# Patient Record
Sex: Female | Born: 1970 | Race: White | Hispanic: No | Marital: Single | Smoking: Never smoker
Health system: Southern US, Community
[De-identification: ages and names within clinical notes are randomized; demographics above are authoritative.]

## PROBLEM LIST (undated history)

## (undated) DIAGNOSIS — Z8489 Family history of other specified conditions: Secondary | ICD-10-CM

## (undated) DIAGNOSIS — C801 Malignant (primary) neoplasm, unspecified: Secondary | ICD-10-CM

## (undated) DIAGNOSIS — L570 Actinic keratosis: Secondary | ICD-10-CM

## (undated) DIAGNOSIS — K219 Gastro-esophageal reflux disease without esophagitis: Secondary | ICD-10-CM

## (undated) HISTORY — DX: Actinic keratosis: L57.0

## (undated) HISTORY — PX: ESOPHAGOGASTRODUODENOSCOPY: SHX1529

---

## 2005-08-09 ENCOUNTER — Ambulatory Visit: Payer: Self-pay

## 2005-08-25 ENCOUNTER — Ambulatory Visit: Payer: Self-pay

## 2007-12-19 ENCOUNTER — Ambulatory Visit: Payer: Self-pay | Admitting: Otolaryngology

## 2007-12-25 ENCOUNTER — Ambulatory Visit: Payer: Self-pay | Admitting: Otolaryngology

## 2008-12-24 ENCOUNTER — Ambulatory Visit: Payer: Self-pay | Admitting: Obstetrics and Gynecology

## 2010-03-26 ENCOUNTER — Ambulatory Visit: Payer: Self-pay

## 2011-06-09 ENCOUNTER — Ambulatory Visit: Payer: Self-pay

## 2011-07-08 ENCOUNTER — Ambulatory Visit: Payer: Self-pay | Admitting: Gastroenterology

## 2011-07-12 LAB — PATHOLOGY REPORT

## 2012-06-09 ENCOUNTER — Ambulatory Visit: Payer: Self-pay

## 2013-08-03 ENCOUNTER — Ambulatory Visit: Payer: Self-pay

## 2014-08-06 ENCOUNTER — Other Ambulatory Visit: Payer: Self-pay | Admitting: Obstetrics and Gynecology

## 2014-08-06 DIAGNOSIS — Z1231 Encounter for screening mammogram for malignant neoplasm of breast: Secondary | ICD-10-CM

## 2014-08-21 ENCOUNTER — Ambulatory Visit: Payer: Self-pay | Attending: Obstetrics and Gynecology

## 2014-08-27 ENCOUNTER — Encounter: Payer: Self-pay | Admitting: *Deleted

## 2014-08-28 ENCOUNTER — Encounter: Payer: Self-pay | Admitting: *Deleted

## 2014-09-02 NOTE — Discharge Instructions (Signed)

## 2014-09-03 ENCOUNTER — Ambulatory Visit: Payer: BLUE CROSS/BLUE SHIELD | Admitting: Anesthesiology

## 2014-09-03 ENCOUNTER — Ambulatory Visit
Admission: RE | Admit: 2014-09-03 | Discharge: 2014-09-03 | Disposition: A | Discharge status: Home / Self Care | Payer: BLUE CROSS/BLUE SHIELD | Source: Ambulatory Visit | Attending: Gastroenterology | Admitting: Gastroenterology

## 2014-09-03 ENCOUNTER — Encounter: Payer: Self-pay | Admitting: Anesthesiology

## 2014-09-03 ENCOUNTER — Encounter
Admission: RE | Disposition: A | Discharge status: Home / Self Care | Payer: Self-pay | Source: Ambulatory Visit | Attending: Gastroenterology

## 2014-09-03 DIAGNOSIS — K2950 Unspecified chronic gastritis without bleeding: Secondary | ICD-10-CM | POA: Diagnosis not present

## 2014-09-03 DIAGNOSIS — Z79899 Other long term (current) drug therapy: Secondary | ICD-10-CM | POA: Diagnosis not present

## 2014-09-03 DIAGNOSIS — K210 Gastro-esophageal reflux disease with esophagitis: Secondary | ICD-10-CM | POA: Diagnosis present

## 2014-09-03 DIAGNOSIS — R1013 Epigastric pain: Secondary | ICD-10-CM | POA: Insufficient documentation

## 2014-09-03 HISTORY — DX: Gastro-esophageal reflux disease without esophagitis: K21.9

## 2014-09-03 HISTORY — DX: Family history of other specified conditions: Z84.89

## 2014-09-03 HISTORY — PX: ESOPHAGOGASTRODUODENOSCOPY: SHX5428

## 2014-09-03 SURGERY — EGD (ESOPHAGOGASTRODUODENOSCOPY)
Anesthesia: Monitor Anesthesia Care | Wound class: Clean Contaminated

## 2014-09-03 MED ORDER — PROPOFOL 10 MG/ML IV BOLUS
INTRAVENOUS | Status: DC | PRN
  Administered 2014-09-03: 100 mg via INTRAVENOUS
  Administered 2014-09-03: 50 mg via INTRAVENOUS
  Administered 2014-09-03: 100 mg via INTRAVENOUS
  Administered 2014-09-03: 30 mg via INTRAVENOUS

## 2014-09-03 MED ORDER — LIDOCAINE HCL (CARDIAC) 20 MG/ML IV SOLN
INTRAVENOUS | Status: DC | PRN
  Administered 2014-09-03: 30 mg via INTRAVENOUS

## 2014-09-03 MED ORDER — PROMETHAZINE HCL 25 MG/ML IJ SOLN: 6.2500 mg | INTRAMUSCULAR | Status: DC | PRN

## 2014-09-03 MED ORDER — LACTATED RINGERS IV SOLN
INTRAVENOUS | Status: DC
  Administered 2014-09-03 (×2): via INTRAVENOUS

## 2014-09-03 MED ORDER — SODIUM CHLORIDE 0.9 % IV SOLN: INTRAVENOUS | Status: DC

## 2014-09-03 MED ORDER — GLYCOPYRROLATE 0.2 MG/ML IJ SOLN
INTRAMUSCULAR | Status: DC | PRN
  Administered 2014-09-03: 0.2 mg via INTRAVENOUS

## 2014-09-03 SURGICAL SUPPLY — 40 items
BALLN DILATOR 10-12 8 (BALLOONS)
BALLN DILATOR 12-15 8 (BALLOONS)
BALLN DILATOR 15-18 8 (BALLOONS)
BALLN DILATOR CRE 0-12 8 (BALLOONS)
BALLN DILATOR ESOPH 8 10 CRE (MISCELLANEOUS) IMPLANT
BALLOON DILATOR 12-15 8 (BALLOONS) IMPLANT
BALLOON DILATOR 15-18 8 (BALLOONS) IMPLANT
BALLOON DILATOR CRE 0-12 8 (BALLOONS) IMPLANT
BLOCK BITE 60FR ADLT L/F GRN (MISCELLANEOUS) ×3 IMPLANT
CANISTER SUCT 1200ML W/VALVE (MISCELLANEOUS) ×3 IMPLANT
FCP ESCP3.2XJMB 240X2.8X (MISCELLANEOUS)
FORCEPS BIOP RAD 4 LRG CAP 4 (CUTTING FORCEPS) ×3 IMPLANT
FORCEPS BIOP RJ4 240 W/NDL (MISCELLANEOUS)
FORCEPS ESCP3.2XJMB 240X2.8X (MISCELLANEOUS) IMPLANT
GOWN CVR UNV OPN BCK APRN NK (MISCELLANEOUS) ×1 IMPLANT
GOWN ISOL THUMB LOOP REG UNIV (MISCELLANEOUS) ×2
GOWN STRL REUS W/ TWL LRG LVL3 (GOWN DISPOSABLE) ×1 IMPLANT
GOWN STRL REUS W/TWL LRG LVL3 (GOWN DISPOSABLE) ×2
HEMOCLIP INSTINCT (CLIP) IMPLANT
INJECTOR VARIJECT VIN23 (MISCELLANEOUS) IMPLANT
KIT CO2 TUBING (TUBING) IMPLANT
KIT DEFENDO VALVE AND CONN (KITS) IMPLANT
KIT ENDO PROCEDURE OLY (KITS) ×3 IMPLANT
LIGATOR MULTIBAND 6SHOOTER MBL (MISCELLANEOUS) IMPLANT
MARKER SPOT ENDO TATTOO 5ML (MISCELLANEOUS) IMPLANT
PAD GROUND ADULT SPLIT (MISCELLANEOUS) IMPLANT
SNARE SHORT THROW 13M SML OVAL (MISCELLANEOUS) IMPLANT
SNARE SHORT THROW 30M LRG OVAL (MISCELLANEOUS) IMPLANT
SPOT EX ENDOSCOPIC TATTOO (MISCELLANEOUS)
SUCTION POLY TRAP 4CHAMBER (MISCELLANEOUS) IMPLANT
SYR INFLATION 60ML (SYRINGE) IMPLANT
TRAP SUCTION POLY (MISCELLANEOUS) IMPLANT
TUBING CONN 6MMX3.1M (TUBING)
TUBING SUCTION CONN 0.25 STRL (TUBING) IMPLANT
UNDERPAD 30X60 958B10 (PK) (MISCELLANEOUS) IMPLANT
VALVE BIOPSY ENDO (VALVE) IMPLANT
VARIJECT INJECTOR VIN23 (MISCELLANEOUS)
WATER AUXILLARY (MISCELLANEOUS) IMPLANT
WATER STERILE IRR 500ML POUR (IV SOLUTION) ×3 IMPLANT
WIRE CRE 18-20MM 8CM F G (MISCELLANEOUS) IMPLANT

## 2014-09-03 NOTE — Transfer of Care (Signed)
Immediate Anesthesia Transfer of Care Note  Patient: Pamela Colon  Procedure(s) Performed: Procedure(s): ESOPHAGOGASTRODUODENOSCOPY (EGD) (N/A)  Patient Location: PACU  Anesthesia Type: MAC  Level of Consciousness: awake, alert  and patient cooperative  Airway and Oxygen Therapy: Patient Spontanous Breathing and Patient connected to supplemental oxygen  Post-op Assessment: Post-op Vital signs reviewed, Patient's Cardiovascular Status Stable, Respiratory Function Stable, Patent Airway and No signs of Nausea or vomiting  Post-op Vital Signs: Reviewed and stable  Complications: No apparent anesthesia complications

## 2014-09-03 NOTE — Anesthesia Postprocedure Evaluation (Signed)
  Anesthesia Post-op Note  Patient: Pamela Colon  Procedure(s) Performed: Procedure(s): ESOPHAGOGASTRODUODENOSCOPY (EGD) (N/A)  Anesthesia type:MAC  Patient location: PACU  Post pain: Pain level controlled  Post assessment: Post-op Vital signs reviewed, Patient's Cardiovascular Status Stable, Respiratory Function Stable, Patent Airway and No signs of Nausea or vomiting  Post vital signs: Reviewed and stable  Last Vitals:  Filed Vitals:   09/03/14 0953  BP: 111/84  Pulse: 66  Temp:   Resp: 16    Level of consciousness: awake, alert  and patient cooperative  Complications: No apparent anesthesia complications

## 2014-09-03 NOTE — H&P (Signed)
  Date of Initial H&P:08/13/2014  History reviewed, patient examined, no change in status, stable for surgery.

## 2014-09-03 NOTE — Op Note (Signed)
[facility] Gastroenterology Patient Name: Pamela Colon Procedure Date: 09/03/2014 8:57 AM MRN: [id number] Account #: [id number] Date of Birth: April 15, 1970 Admit Type: Outpatient Age: 44 Room: [facility] OR ROOM 01 Gender: Female Note Status: Finalized Procedure:         Upper GI endoscopy Indications:       Epigastric abdominal pain, Suspected esophageal reflux Providers:         Lupita Dawn. Candace Cruise, MD Medicines:         Monitored Anesthesia Care Complications:     No immediate complications. Procedure:         Pre-Anesthesia Assessment:                    - Prior to the procedure, a History and Physical was                     performed, and patient medications, allergies and                     sensitivities were reviewed. The patient's tolerance of                     previous anesthesia was reviewed.                    - The risks and benefits of the procedure and the sedation                     options and risks were discussed with the patient. All                     questions were answered and informed consent was obtained.                    - After reviewing the risks and benefits, the patient was                     deemed in satisfactory condition to undergo the procedure.                    After obtaining informed consent, the endoscope was passed                     under direct vision. Throughout the procedure, the                     patient's blood pressure, pulse, and oxygen saturations                     were monitored continuously. The Olympus GIF H180J                     colonscope (X#:[id number]) was introduced through the mouth,                     and advanced to the second part of duodenum. The upper GI                     endoscopy was accomplished without difficulty. The patient                     tolerated the procedure well. Findings:      LA Grade A (one or more mucosal breaks less than 5 mm, not extending  between tops of 2 mucosal  folds) esophagitis was found in the lower       third of the esophagus.      The exam was otherwise without abnormality.      The entire examined stomach was normal. Biopsies were taken with a cold       forceps for histology.      The examined duodenum was normal. Impression:        - LA Grade A reflux esophagitis. Rule out Barrett's                     esophagus.                    - The examination was otherwise normal.                    - Normal stomach. Biopsied.                    - Normal examined duodenum. Recommendation:    - Discharge patient to home.                    - Observe patient's clinical course.                    - Await pathology results.                    - The findings and recommendations were discussed with the                     patient. Procedure Code(s): --- Professional ---                    [phone], Esophagogastroduodenoscopy, flexible, transoral;                     with biopsy, single or multiple Diagnosis Code(s): --- Professional ---                    K21.0, Gastro-esophageal reflux disease with esophagitis                    R10.13, Epigastric pain CPT copyright 2014 American Medical Association. All rights reserved. The codes documented in this report are preliminary and upon coder review may  be revised to meet current compliance requirements. Hulen Luster, MD 09/03/2014 9:34:59 AM This report has been signed electronically. Number of Addenda: 0 Note Initiated On: 09/03/2014 8:57 AM      [facility]

## 2014-09-03 NOTE — Anesthesia Preprocedure Evaluation (Addendum)
Anesthesia Evaluation  Patient identified by MRN, date of birth, ID band Patient awake    Reviewed: Allergy & Precautions, H&P , NPO status , Patient's Chart, lab work & pertinent test results  History of Anesthesia Complications Negative for: history of anesthetic complications  Airway Mallampati: II  TM Distance: >3 FB Neck ROM: full    Dental no notable dental hx.    Pulmonary neg pulmonary ROS,    Pulmonary exam normal       Cardiovascular negative cardio ROS Normal cardiovascular exam    Neuro/Psych    GI/Hepatic Neg liver ROS, GERD-  Medicated and Controlled,  Endo/Other  negative endocrine ROS  Renal/GU negative Renal ROS     Musculoskeletal   Abdominal   Peds  Hematology negative hematology ROS (+)   Anesthesia Other Findings   Reproductive/Obstetrics negative OB ROS                            Anesthesia Physical Anesthesia Plan  ASA: I  Anesthesia Plan: MAC   Post-op Pain Management:    Induction:   Airway Management Planned:   Additional Equipment:   Intra-op Plan:   Post-operative Plan:   Informed Consent: I have reviewed the patients History and Physical, chart, labs and discussed the procedure including the risks, benefits and alternatives for the proposed anesthesia with the patient or authorized representative who has indicated his/her understanding and acceptance.     Plan Discussed with: CRNA  Anesthesia Plan Comments:         Anesthesia Quick Evaluation

## 2014-09-03 NOTE — Anesthesia Procedure Notes (Signed)
Procedure Name: MAC Performed by: Quame Spratlin Pre-anesthesia Checklist: Patient identified, Emergency Drugs available, Suction available, Patient being monitored and Timeout performed Patient Re-evaluated:Patient Re-evaluated prior to inductionOxygen Delivery Method: Nasal cannula       

## 2014-09-04 ENCOUNTER — Encounter: Payer: Self-pay | Admitting: Gastroenterology

## 2014-09-06 LAB — SURGICAL PATHOLOGY

## 2014-10-03 ENCOUNTER — Ambulatory Visit
Admission: RE | Admit: 2014-10-03 | Discharge: 2014-10-03 | Disposition: A | Discharge status: Home / Self Care | Payer: BLUE CROSS/BLUE SHIELD | Source: Ambulatory Visit | Attending: Obstetrics and Gynecology | Admitting: Obstetrics and Gynecology

## 2014-10-03 DIAGNOSIS — Z1231 Encounter for screening mammogram for malignant neoplasm of breast: Secondary | ICD-10-CM | POA: Diagnosis present

## 2014-10-21 DIAGNOSIS — C4491 Basal cell carcinoma of skin, unspecified: Secondary | ICD-10-CM

## 2014-10-21 HISTORY — DX: Basal cell carcinoma of skin, unspecified: C44.91

## 2015-08-22 ENCOUNTER — Other Ambulatory Visit: Payer: Self-pay | Admitting: Obstetrics and Gynecology

## 2015-08-22 DIAGNOSIS — Z1231 Encounter for screening mammogram for malignant neoplasm of breast: Secondary | ICD-10-CM

## 2015-10-06 ENCOUNTER — Ambulatory Visit: Payer: BLUE CROSS/BLUE SHIELD

## 2015-11-17 ENCOUNTER — Ambulatory Visit
Admission: RE | Admit: 2015-11-17 | Discharge: 2015-11-17 | Disposition: A | Discharge status: Home / Self Care | Payer: Managed Care, Other (non HMO) | Source: Ambulatory Visit | Attending: Obstetrics and Gynecology | Admitting: Obstetrics and Gynecology

## 2015-11-17 ENCOUNTER — Ambulatory Visit: Payer: BLUE CROSS/BLUE SHIELD

## 2015-11-17 ENCOUNTER — Other Ambulatory Visit: Payer: Self-pay | Admitting: Obstetrics and Gynecology

## 2015-11-17 DIAGNOSIS — Z1231 Encounter for screening mammogram for malignant neoplasm of breast: Secondary | ICD-10-CM | POA: Diagnosis not present

## 2015-11-17 HISTORY — DX: Malignant (primary) neoplasm, unspecified: C80.1

## 2016-01-28 ENCOUNTER — Other Ambulatory Visit: Payer: Self-pay | Admitting: Obstetrics and Gynecology

## 2016-01-28 DIAGNOSIS — Z1231 Encounter for screening mammogram for malignant neoplasm of breast: Secondary | ICD-10-CM

## 2017-01-05 ENCOUNTER — Ambulatory Visit
Admission: RE | Admit: 2017-01-05 | Discharge: 2017-01-05 | Disposition: A | Discharge status: Home / Self Care | Payer: Managed Care, Other (non HMO) | Source: Ambulatory Visit | Attending: Obstetrics and Gynecology | Admitting: Obstetrics and Gynecology

## 2017-01-05 DIAGNOSIS — Z1231 Encounter for screening mammogram for malignant neoplasm of breast: Secondary | ICD-10-CM | POA: Diagnosis not present

## 2018-02-01 ENCOUNTER — Other Ambulatory Visit: Payer: Self-pay | Admitting: Obstetrics and Gynecology

## 2018-02-01 DIAGNOSIS — Z1231 Encounter for screening mammogram for malignant neoplasm of breast: Secondary | ICD-10-CM

## 2018-02-09 ENCOUNTER — Ambulatory Visit: Payer: Managed Care, Other (non HMO)

## 2018-02-23 ENCOUNTER — Encounter (INDEPENDENT_AMBULATORY_CARE_PROVIDER_SITE_OTHER): Payer: Self-pay

## 2018-02-23 ENCOUNTER — Ambulatory Visit
Admission: RE | Admit: 2018-02-23 | Discharge: 2018-02-23 | Disposition: A | Discharge status: Home / Self Care | Payer: Managed Care, Other (non HMO) | Source: Ambulatory Visit | Attending: Obstetrics and Gynecology | Admitting: Obstetrics and Gynecology

## 2018-02-23 DIAGNOSIS — Z1231 Encounter for screening mammogram for malignant neoplasm of breast: Secondary | ICD-10-CM | POA: Diagnosis not present

## 2019-02-14 ENCOUNTER — Other Ambulatory Visit: Payer: Self-pay | Admitting: Certified Nurse Midwife

## 2019-02-14 DIAGNOSIS — Z1231 Encounter for screening mammogram for malignant neoplasm of breast: Secondary | ICD-10-CM

## 2019-02-27 ENCOUNTER — Ambulatory Visit
Admission: RE | Admit: 2019-02-27 | Discharge: 2019-02-27 | Disposition: A | Discharge status: Home / Self Care | Payer: Managed Care, Other (non HMO) | Source: Ambulatory Visit | Attending: Certified Nurse Midwife | Admitting: Certified Nurse Midwife

## 2019-02-27 ENCOUNTER — Encounter (INDEPENDENT_AMBULATORY_CARE_PROVIDER_SITE_OTHER): Payer: Self-pay

## 2019-02-27 ENCOUNTER — Other Ambulatory Visit: Payer: Self-pay

## 2019-02-27 DIAGNOSIS — Z1231 Encounter for screening mammogram for malignant neoplasm of breast: Secondary | ICD-10-CM | POA: Insufficient documentation

## 2019-08-26 IMAGING — MG DIGITAL SCREENING BILATERAL MAMMOGRAM WITH TOMO AND CAD
8 series · 8 of 24 positions shown · non-contrast
Comparison: Previous exam(s).

CLINICAL DATA: Screening.

EXAM:
DIGITAL SCREENING BILATERAL MAMMOGRAM WITH TOMO AND CAD

[R MLO synth-2D]
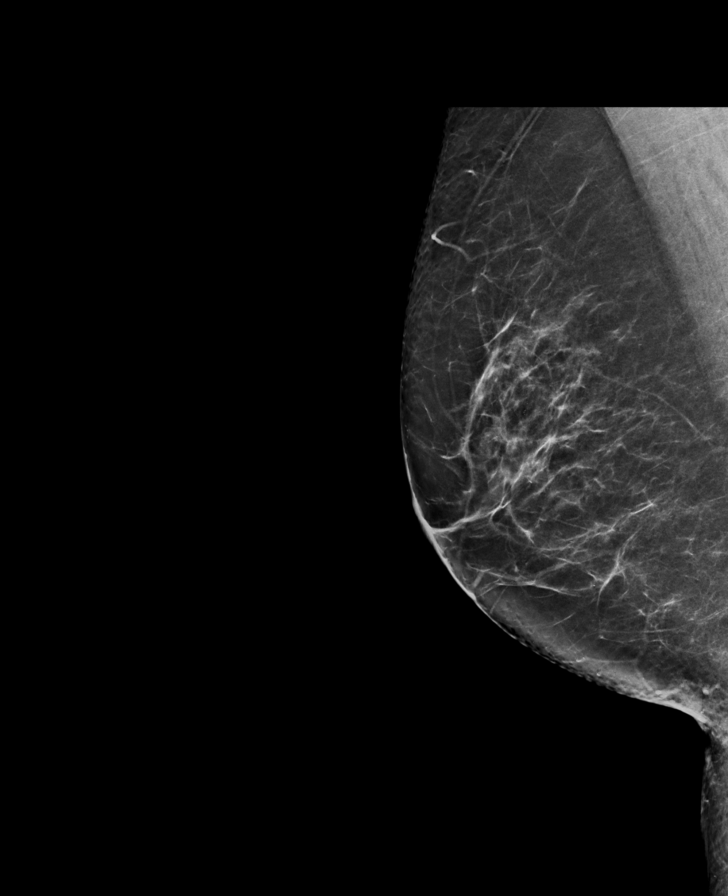

[L CC synth-2D]
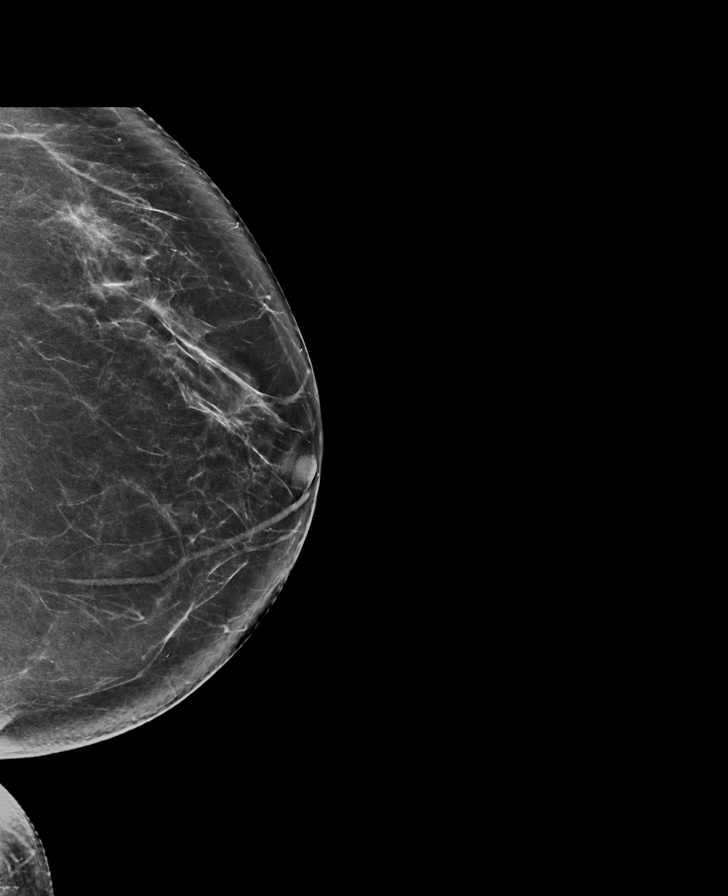

[R CC synth-2D]
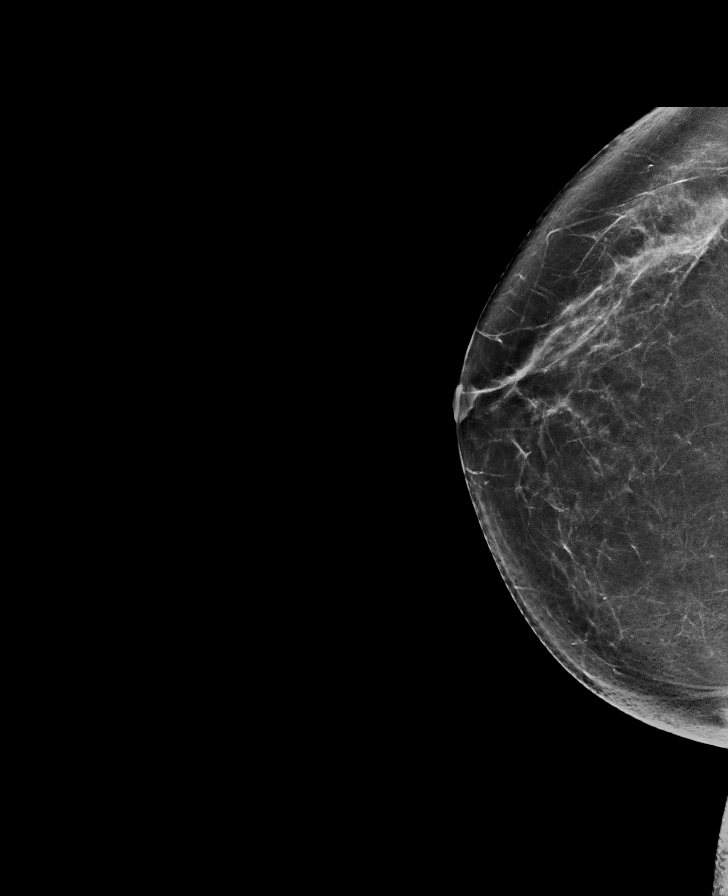

[L MLO synth-2D]
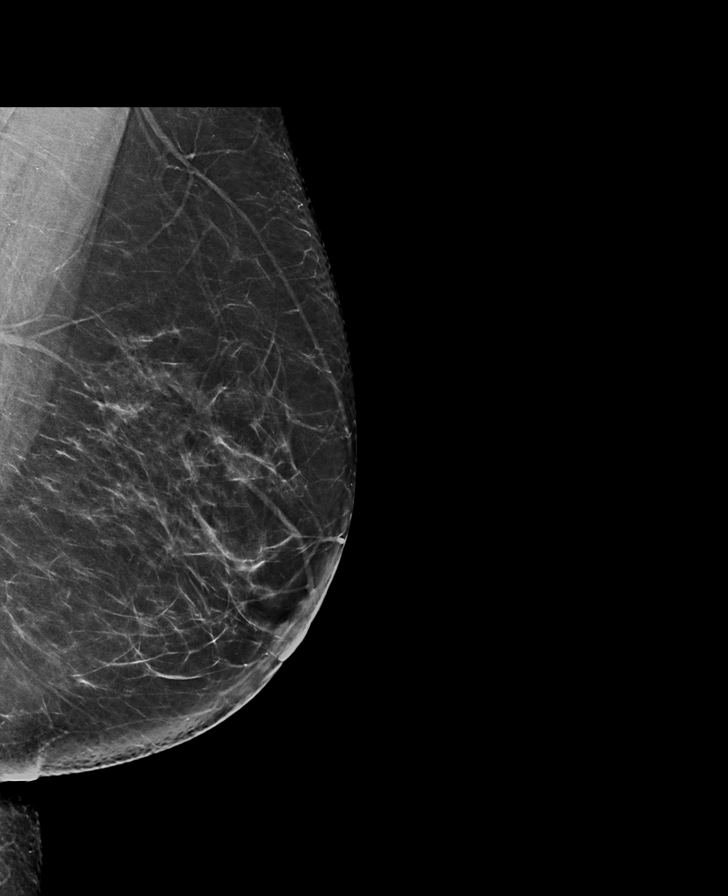

[R MLO tomo · tomo slice 39/76.0]
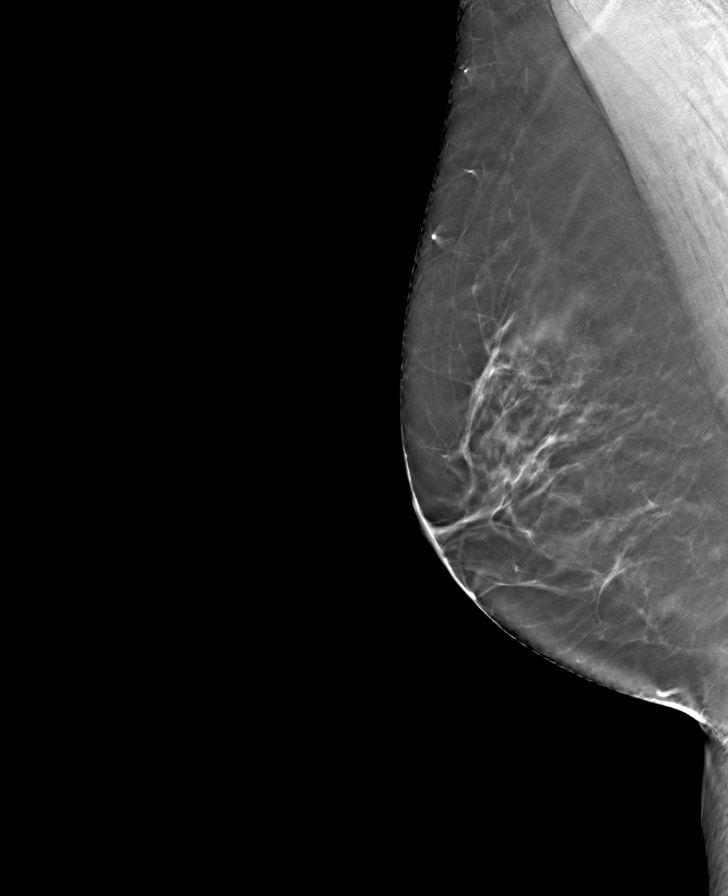

[R CC tomo · tomo slice 39/78.0]
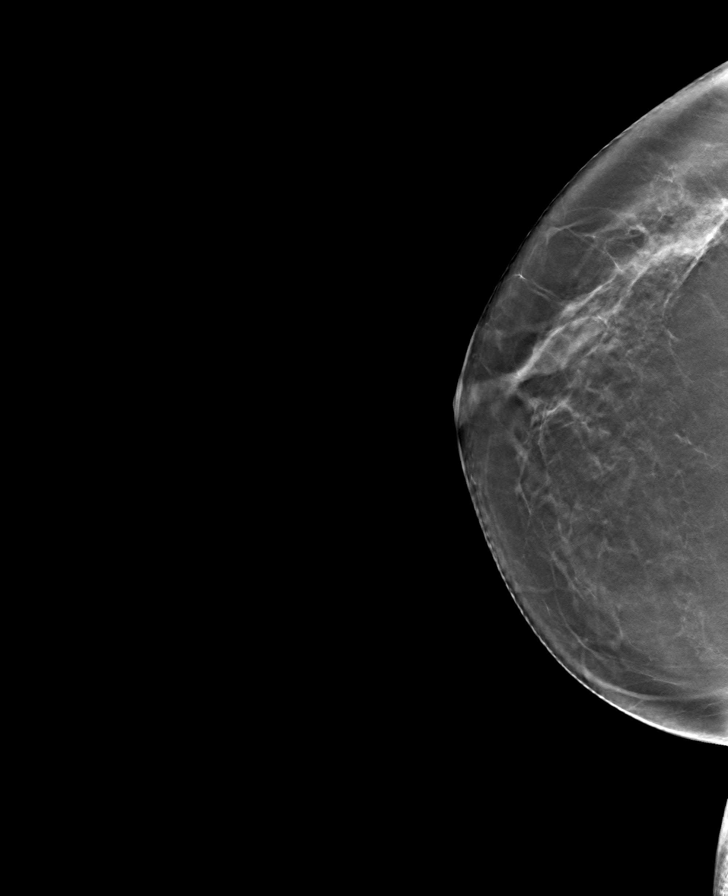

[L MLO tomo · tomo slice 41/80.0]
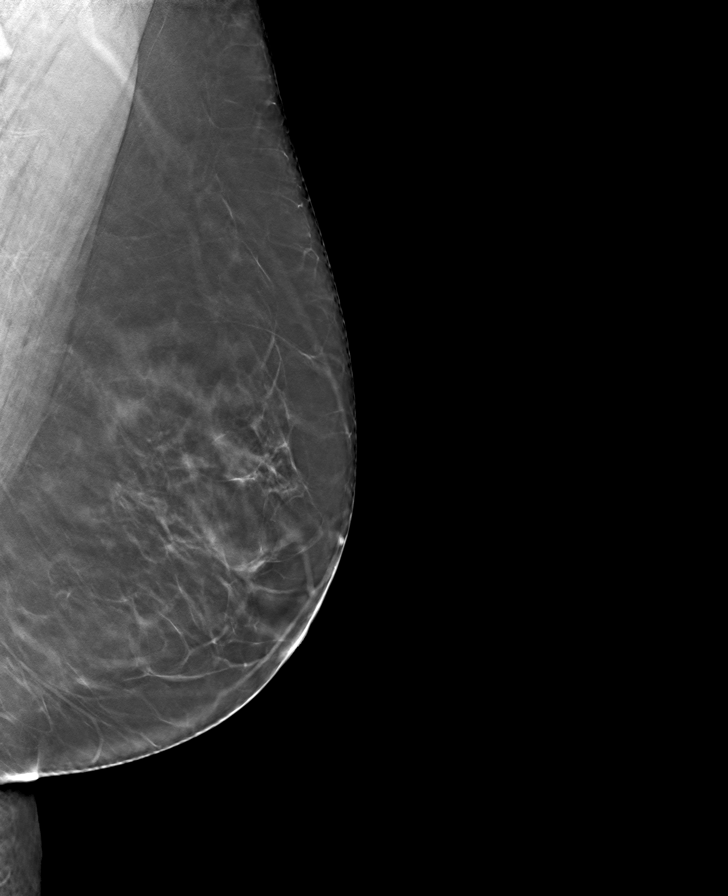

[L CC tomo · tomo slice 41/81.0]
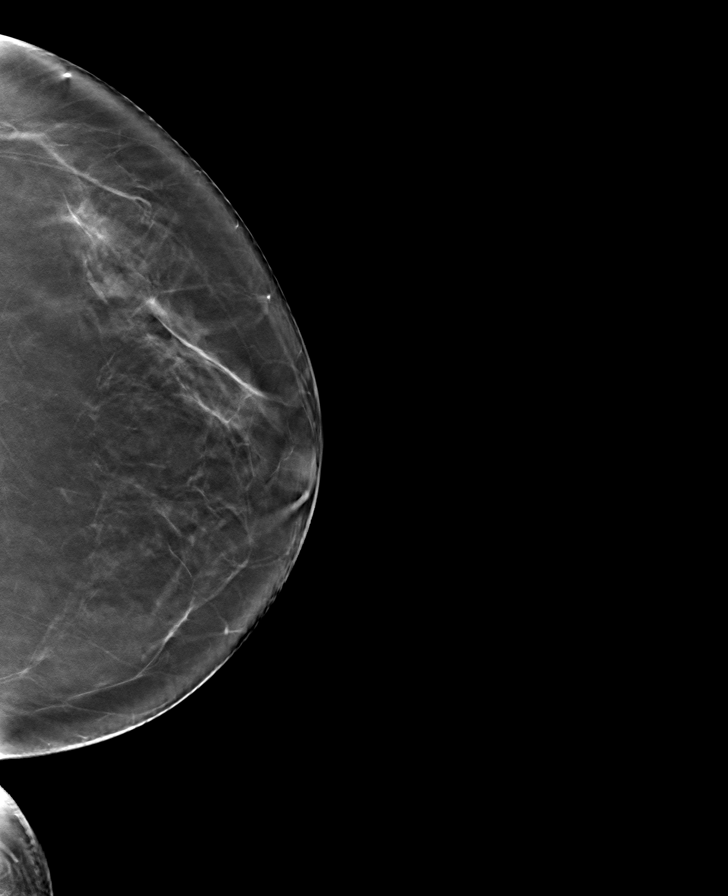

[8 of 24 positions shown; findings below may reference images not displayed]

ACR Breast Density Category b: There are scattered areas of
fibroglandular density.
FINDINGS: There are no findings suspicious for malignancy. Images were
processed with CAD.
IMPRESSION: No mammographic evidence of malignancy. A result letter of this
screening mammogram will be mailed directly to the patient.

RECOMMENDATION:
Screening mammogram in one year. (Code:[id number]-U-775)

BI-RADS CATEGORY  1: Negative.

## 2019-10-29 ENCOUNTER — Other Ambulatory Visit: Payer: Self-pay

## 2019-10-29 ENCOUNTER — Ambulatory Visit: Payer: Managed Care, Other (non HMO) | Admitting: Dermatology

## 2019-10-29 DIAGNOSIS — L814 Other melanin hyperpigmentation: Secondary | ICD-10-CM

## 2019-10-29 DIAGNOSIS — L813 Cafe au lait spots: Secondary | ICD-10-CM

## 2019-10-29 DIAGNOSIS — L720 Epidermal cyst: Secondary | ICD-10-CM

## 2019-10-29 DIAGNOSIS — T23261A Burn of second degree of back of right hand, initial encounter: Secondary | ICD-10-CM | POA: Diagnosis not present

## 2019-10-29 DIAGNOSIS — Z1283 Encounter for screening for malignant neoplasm of skin: Secondary | ICD-10-CM | POA: Diagnosis not present

## 2019-10-29 DIAGNOSIS — L820 Inflamed seborrheic keratosis: Secondary | ICD-10-CM

## 2019-10-29 DIAGNOSIS — Z85828 Personal history of other malignant neoplasm of skin: Secondary | ICD-10-CM

## 2019-10-29 DIAGNOSIS — D229 Melanocytic nevi, unspecified: Secondary | ICD-10-CM

## 2019-10-29 DIAGNOSIS — D1800 Hemangioma unspecified site: Secondary | ICD-10-CM

## 2019-10-29 DIAGNOSIS — L821 Other seborrheic keratosis: Secondary | ICD-10-CM

## 2019-10-29 DIAGNOSIS — L578 Other skin changes due to chronic exposure to nonionizing radiation: Secondary | ICD-10-CM

## 2019-10-29 NOTE — Patient Instructions (Addendum)
Melanoma ABCDEs  Melanoma is the most dangerous type of skin cancer, and is the leading cause of death from skin disease.  You are more likely to develop melanoma if you:  Have light-colored skin, light-colored eyes, or red or blond hair  Spend a lot of time in the sun  Tan regularly, either outdoors or in a tanning bed  Have had blistering sunburns, especially during childhood  Have a close family member who has had a melanoma  Have atypical moles or large birthmarks  Early detection of melanoma is key since treatment is typically straightforward and cure rates are extremely high if we catch it early.   The first sign of melanoma is often a change in a mole or a new dark spot.  The ABCDE system is a way of remembering the signs of melanoma.  A for asymmetry:  The two halves do not match. B for border:  The edges of the growth are irregular. C for color:  A mixture of colors are present instead of an even brown color. D for diameter:  Melanomas are usually (but not always) greater than 6mm - the size of a pencil eraser. E for evolution:  The spot keeps changing in size, shape, and color.  Please check your skin once per month between visits. You can use a small mirror in front and a large mirror behind you to keep an eye on the back side or your body.   If you see any new or changing lesions before your next follow-up, please call to schedule a visit.  Please continue daily skin protection including broad spectrum sunscreen SPF 30+ to sun-exposed areas, reapplying every 2 hours as needed when you're outdoors.    Cryotherapy Aftercare  . Wash gently with soap and water everyday.   . Apply Vaseline and Band-Aid daily until healed.   Seborrheic Keratosis  What causes seborrheic keratoses? Seborrheic keratoses are harmless, common skin growths that first appear during adult life.  As time goes by, more growths appear.  Some people may develop a large number of them.  Seborrheic  keratoses appear on both covered and uncovered body parts.  They are not caused by sunlight.  The tendency to develop seborrheic keratoses can be inherited.  They vary in color from skin-colored to gray, brown, or even black.  They can be either smooth or have a rough, warty surface.   Seborrheic keratoses are superficial and look as if they were stuck on the skin.  Under the microscope this type of keratosis looks like layers upon layers of skin.  That is why at times the top layer may seem to fall off, but the rest of the growth remains and re-grows.    Treatment Seborrheic keratoses do not need to be treated, but can easily be removed in the office.  Seborrheic keratoses often cause symptoms when they rub on clothing or jewelry.  Lesions can be in the way of shaving.  If they become inflamed, they can cause itching, soreness, or burning.  Removal of a seborrheic keratosis can be accomplished by freezing, burning, or surgery. If any spot bleeds, scabs, or grows rapidly, please return to have it checked, as these can be an indication of a skin cancer.  

## 2019-10-29 NOTE — Progress Notes (Signed)
Follow-Up Visit   Subjective  Pamela Colon is a 49 y.o. female who presents for the following: Annual Exam.  Patient here today for TBSE. She does have a history of BCC. Patient advises there is a mole on her back that has been itchy for a few months. She has another irritated spot on her shoulder that has been frozen several times but comes back.  Nothing else new or changing that patient is aware of.   The following portions of the chart were reviewed this encounter and updated as appropriate:      Review of Systems:  No other skin or systemic complaints except as noted in HPI or Assessment and Plan.  Objective  Well appearing patient in no apparent distress; mood and affect are within normal limits.  A full examination was performed including scalp, head, eyes, ears, nose, lips, neck, chest, axillae, abdomen, back, buttocks, bilateral upper extremities, bilateral lower extremities, hands, feet, fingers, toes, fingernails, and toenails. All findings within normal limits unless otherwise noted below.  Objective  Right Upper Back x 1, right top of shoulder x 1 (2): Erythematous keratotic or waxy stuck-on papule or plaque.   Objective  Periocular: Tiny smooth white papule(s).   Objective  Right Hand: Linear hyperpigmented eroded patch- history of recent burn couple days ago.   Assessment & Plan  Inflamed seborrheic keratosis (2) Right Upper Back x 1, right top of shoulder x 1  Destruction of lesion - Right Upper Back x 1, right top of shoulder x 1 Complexity: simple   Destruction method: cryotherapy   Informed consent: discussed and consent obtained   Lesion destroyed using liquid nitrogen: Yes   Region frozen until ice ball extended beyond lesion: Yes   Outcome: patient tolerated procedure well with no complications   Post-procedure details: wound care instructions given    [place] Periocular  Benign, observe.  Start OTC adapalene 0.1% gel qohs prn as  tolerated, sample of Effaclar given.    Partial thickness burn of back of right hand, initial encounter Right Hand  Recommend wound care- daily Vaseline and keep covered with band-aid, sun protection   Lentigines - Scattered tan macules - Discussed due to sun exposure - Benign, observe - Call for any changes  Seborrheic Keratoses - Stuck-on, waxy, tan-brown papules and plaques  - Discussed benign etiology and prognosis. - Observe - Call for any changes  Melanocytic Nevi - Tan-brown and/or pink-flesh-colored symmetric macules and papules back - Benign appearing on exam today - Observation - Call clinic for new or changing moles - Recommend daily use of broad spectrum spf 30+ sunscreen to sun-exposed areas.   Hemangiomas - Red papules - Discussed benign nature - Observe - Call for any changes  Actinic Damage - diffuse scaly erythematous macules with underlying dyspigmentation - Recommend daily broad spectrum sunscreen SPF 30+ to sun-exposed areas, reapply every 2 hours as needed.  - Call for new or changing lesions.  Skin cancer screening performed today.  History of Basal Cell Carcinoma of the Skin - No evidence of recurrence today at right upper sternum - Recommend regular full body skin exams - Recommend daily broad spectrum sunscreen SPF 30+ to sun-exposed areas, reapply every 2 hours as needed.  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  Graciella Belton, RMA, am acting as scribe for Brendolyn Patty, MD . Documentation: I have reviewed the above documentation for accuracy and completeness, and I agree with the above.  Brendolyn Patty MD

## 2020-03-05 ENCOUNTER — Other Ambulatory Visit: Payer: Self-pay

## 2020-03-05 DIAGNOSIS — Z1231 Encounter for screening mammogram for malignant neoplasm of breast: Secondary | ICD-10-CM

## 2020-03-13 ENCOUNTER — Other Ambulatory Visit: Payer: Self-pay

## 2020-03-13 ENCOUNTER — Ambulatory Visit
Admission: RE | Admit: 2020-03-13 | Discharge: 2020-03-13 | Disposition: A | Discharge status: Home / Self Care | Payer: Managed Care, Other (non HMO) | Source: Ambulatory Visit

## 2020-03-13 DIAGNOSIS — Z1231 Encounter for screening mammogram for malignant neoplasm of breast: Secondary | ICD-10-CM

## 2020-03-20 ENCOUNTER — Other Ambulatory Visit: Payer: Self-pay

## 2020-03-20 DIAGNOSIS — R921 Mammographic calcification found on diagnostic imaging of breast: Secondary | ICD-10-CM

## 2020-03-20 DIAGNOSIS — R928 Other abnormal and inconclusive findings on diagnostic imaging of breast: Secondary | ICD-10-CM

## 2020-03-31 ENCOUNTER — Other Ambulatory Visit: Payer: Self-pay

## 2020-03-31 ENCOUNTER — Ambulatory Visit
Admission: RE | Admit: 2020-03-31 | Discharge: 2020-03-31 | Disposition: A | Discharge status: Home / Self Care | Payer: Managed Care, Other (non HMO) | Source: Ambulatory Visit

## 2020-03-31 DIAGNOSIS — R928 Other abnormal and inconclusive findings on diagnostic imaging of breast: Secondary | ICD-10-CM | POA: Diagnosis present

## 2020-03-31 DIAGNOSIS — R921 Mammographic calcification found on diagnostic imaging of breast: Secondary | ICD-10-CM | POA: Insufficient documentation

## 2020-07-08 ENCOUNTER — Ambulatory Visit: Payer: Managed Care, Other (non HMO) | Admitting: Dermatology

## 2020-11-04 ENCOUNTER — Ambulatory Visit: Payer: Managed Care, Other (non HMO) | Admitting: Dermatology

## 2020-11-04 ENCOUNTER — Other Ambulatory Visit: Payer: Self-pay

## 2020-11-04 DIAGNOSIS — D1800 Hemangioma unspecified site: Secondary | ICD-10-CM

## 2020-11-04 DIAGNOSIS — Z1283 Encounter for screening for malignant neoplasm of skin: Secondary | ICD-10-CM | POA: Diagnosis not present

## 2020-11-04 DIAGNOSIS — W57XXXA Bitten or stung by nonvenomous insect and other nonvenomous arthropods, initial encounter: Secondary | ICD-10-CM

## 2020-11-04 DIAGNOSIS — D2361 Other benign neoplasm of skin of right upper limb, including shoulder: Secondary | ICD-10-CM

## 2020-11-04 DIAGNOSIS — L570 Actinic keratosis: Secondary | ICD-10-CM | POA: Diagnosis not present

## 2020-11-04 DIAGNOSIS — L821 Other seborrheic keratosis: Secondary | ICD-10-CM

## 2020-11-04 DIAGNOSIS — S90561A Insect bite (nonvenomous), right ankle, initial encounter: Secondary | ICD-10-CM | POA: Diagnosis not present

## 2020-11-04 DIAGNOSIS — L814 Other melanin hyperpigmentation: Secondary | ICD-10-CM | POA: Diagnosis not present

## 2020-11-04 DIAGNOSIS — L578 Other skin changes due to chronic exposure to nonionizing radiation: Secondary | ICD-10-CM

## 2020-11-04 DIAGNOSIS — D225 Melanocytic nevi of trunk: Secondary | ICD-10-CM

## 2020-11-04 DIAGNOSIS — Z85828 Personal history of other malignant neoplasm of skin: Secondary | ICD-10-CM

## 2020-11-04 NOTE — Progress Notes (Signed)
Follow-Up Visit   Subjective  Pamela Colon is a 50 y.o. female who presents for the following: Annual Exam (Patient here today for full body exam. She reports no new concerns. ). She has noticed a persistent scaly spot R cheek, that she treats with lotion  Patient here for full body skin exam and skin cancer screening.  The following portions of the chart were reviewed this encounter and updated as appropriate:       Objective  Well appearing patient in no apparent distress; mood and affect are within normal limits.  A full examination was performed including scalp, head, eyes, ears, nose, lips, neck, chest, axillae, abdomen, back, buttocks, bilateral upper extremities, bilateral lower extremities, hands, feet, fingers, toes, fingernails, and toenails. All findings within normal limits unless otherwise noted below.  right lateral cheek x 1 Erythematous thin papules/macules with gritty scale.   Right Ankle - Anterior Excoriated pink papule at right ankle   Assessment & Plan  Actinic keratosis right lateral cheek x 1  Actinic keratoses are precancerous spots that appear secondary to cumulative UV radiation exposure/sun exposure over time. They are chronic with expected duration over 1 year. A portion of actinic keratoses will progress to squamous cell carcinoma of the skin. It is not possible to reliably predict which spots will progress to skin cancer and so treatment is recommended to prevent development of skin cancer.  Recommend daily broad spectrum sunscreen SPF 30+ to sun-exposed areas, reapply every 2 hours as needed.  Recommend staying in the shade or wearing long sleeves, sun glasses (UVA+UVB protection) and wide brim hats (4-inch brim around the entire circumference of the hat). Call for new or changing lesions.  Destruction of lesion - right lateral cheek x 1  Destruction method: cryotherapy   Informed consent: discussed and consent obtained   Lesion destroyed  using liquid nitrogen: Yes   Region frozen until ice ball extended beyond lesion: Yes   Outcome: patient tolerated procedure well with no complications   Post-procedure details: wound care instructions given   Additional details:  Prior to procedure, discussed risks of blister formation, small wound, skin dyspigmentation, or rare scar following cryotherapy. Recommend Vaseline ointment to treated areas while healing.   Bug bite without infection, initial encounter Right Ankle - Anterior  Healing  Not bothersome to patient   No treatment necessary   Lentigines - Scattered tan macules - Due to sun exposure - Benign-appering, observe - Recommend daily broad spectrum sunscreen SPF 30+ to sun-exposed areas, reapply every 2 hours as needed. 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 - Observe - Call for any changes  Melanocytic Nevi - Tan-brown and/or pink-flesh-colored symmetric macules and papules - Benign appearing on exam today - Observation - Call clinic for new or changing moles - Recommend daily use of broad spectrum spf 30+ sunscreen to sun-exposed areas.   Hemangiomas - Red papules - Discussed benign nature - Observe - Call for any changes   Actinic Damage - Chronic condition, secondary to cumulative UV/sun exposure - diffuse scaly erythematous macules with underlying dyspigmentation - Recommend daily broad spectrum sunscreen SPF 30+  to sun-exposed areas, reapply every 2 hours as needed.  - Staying in the shade or wearing long sleeves, sun glasses (UVA+UVB protection) and wide brim hats (4-inch brim  around the entire circumference of the hat) are also recommended for sun protection.  - Call for new or changing lesions.  History of Basal Cell Carcinoma of the Skin - No evidence of recurrence today at right upper sternum (2016) - Recommend regular full body skin exams - Recommend daily broad spectrum sunscreen SPF 30+ to sun-exposed areas, reapply every 2 hours as needed.  - Call if any new or changing lesions are noted between office visits  Skin cancer screening performed today.  Return in about 1 year (around 11/04/2021) for tbse. I, Ruthell Rummage, CMA, am acting as scribe for Brendolyn Patty, MD.  Documentation: I have reviewed the above documentation for accuracy and completeness, and I agree with the above.  Brendolyn Patty MD

## 2020-11-04 NOTE — Patient Instructions (Addendum)
Actinic keratoses are precancerous spots that appear secondary to cumulative UV radiation exposure/sun exposure over time. They are chronic with expected duration over 1 year. A portion of actinic keratoses will progress to squamous cell carcinoma of the skin. It is not possible to reliably predict which spots will progress to skin cancer and so treatment is recommended to prevent development of skin cancer.  Recommend daily broad spectrum sunscreen SPF 30+ to sun-exposed areas, reapply every 2 hours as needed.  Recommend staying in the shade or wearing long sleeves, sun glasses (UVA+UVB protection) and wide brim hats (4-inch brim around the entire circumference of the hat). Call for new or changing lesions.   Cryotherapy Aftercare  Wash gently with soap and water everyday.   Apply Vaseline and Band-Aid daily until healed.   Melanoma ABCDEs  Melanoma is the most dangerous type of skin cancer, and is the leading cause of death from skin disease.  You are more likely to develop melanoma if you: Have light-colored skin, light-colored eyes, or red or blond hair Spend a lot of time in the sun Tan regularly, either outdoors or in a tanning bed Have had blistering sunburns, especially during childhood Have a close family member who has had a melanoma Have atypical moles or large birthmarks  Early detection of melanoma is key since treatment is typically straightforward and cure rates are extremely high if we catch it early.   The first sign of melanoma is often a change in a mole or a new dark spot.  The ABCDE system is a way of remembering the signs of melanoma.  A for asymmetry:  The two halves do not match. B for border:  The edges of the growth are irregular. C for color:  A mixture of colors are present instead of an even brown color. D for diameter:  Melanomas are usually (but not always) greater than 45mm - the size of a pencil eraser. E for evolution:  The spot keeps changing in size,  shape, and color.  Please check your skin once per month between visits. You can use a small mirror in front and a large mirror behind you to keep an eye on the back side or your body.   If you see any new or changing lesions before your next follow-up, please call to schedule a visit.  Please continue daily skin protection including broad spectrum sunscreen SPF 30+ to sun-exposed areas, reapplying every 2 hours as needed when you're outdoors.   Staying in the shade or wearing long sleeves, sun glasses (UVA+UVB protection) and wide brim hats (4-inch brim around the entire circumference of the hat) are also recommended for sun protection.    If you have any questions or concerns for your doctor, please call our main line at [phone] and press option 4 to reach your doctor's medical assistant. If no one answers, please leave a voicemail as directed and we will return your call as soon as possible. Messages left after 4 pm will be answered the following business day.   You may also send [place] a message via [place]. We typically respond to MyChart messages within 1-2 business days.  For prescription refills, please ask your pharmacy to contact our office. Our fax number is [phone].  If you have an urgent issue when the clinic is closed that cannot wait until the next business day, you can page your doctor at the number below.    Please note that while we do our best to be  available for urgent issues outside of office hours, we are not available 24/7.   If you have an urgent issue and are unable to reach [place], you may choose to seek medical care at your doctor's office, retail clinic, urgent care center, or emergency room.  If you have a medical emergency, please immediately call 911 or go to the emergency department.  Pager Numbers  - Dr. Nehemiah Massed: [phone]  - Dr. Laurence Ferrari: [phone]  - Dr. Nicole Kindred: [phone]  In the event of inclement weather, please call our main line at  [phone] for an update on the status of any delays or closures.  Dermatology Medication Tips: Please keep the boxes that topical medications come in in order to help keep track of the instructions about where and how to use these. Pharmacies typically print the medication instructions only on the boxes and not directly on the medication tubes.   If your medication is too expensive, please contact our office at [phone] option 4 or send [place] a message through [place].   We are unable to tell what your co-pay for medications will be in advance as this is different depending on your insurance coverage. However, we may be able to find a substitute medication at lower cost or fill out paperwork to get insurance to cover a needed medication.   If a prior authorization is required to get your medication covered by your insurance company, please allow [place] 1-2 business days to complete this process.  Drug prices often vary depending on where the prescription is filled and some pharmacies may offer cheaper prices.  The website www.goodrx.com contains coupons for medications through different pharmacies. The prices here do not account for what the cost may be with help from insurance (it may be cheaper with your insurance), but the website can give you the price if you did not use any insurance.  - You can print the associated coupon and take it with your prescription to the pharmacy.  - You may also stop by our office during regular business hours and pick up a GoodRx coupon card.  - If you need your prescription sent electronically to a different pharmacy, notify our office through [facility] or by phone at [phone] option 4.

## 2020-11-19 ENCOUNTER — Ambulatory Visit
Admission: RE | Admit: 2020-11-19 | Discharge: 2020-11-19 | Disposition: A | Discharge status: Home / Self Care | Payer: Managed Care, Other (non HMO) | Source: Ambulatory Visit | Attending: Family Medicine | Admitting: Family Medicine

## 2020-11-19 ENCOUNTER — Other Ambulatory Visit: Payer: Self-pay

## 2020-11-19 VITALS — BP 118/80 | HR 100 | Temp 99.3°F | Resp 18 | Ht 69.0 in | Wt 175.9 lb

## 2020-11-19 DIAGNOSIS — L03317 Cellulitis of buttock: Secondary | ICD-10-CM | POA: Diagnosis not present

## 2020-11-19 DIAGNOSIS — L0231 Cutaneous abscess of buttock: Secondary | ICD-10-CM

## 2020-11-19 MED ORDER — MUPIROCIN 2 % EX OINT: 1.0000 "application " | TOPICAL_OINTMENT | Freq: Three times a day (TID) | CUTANEOUS | 0 refills | Status: AC

## 2020-11-19 MED ORDER — DOXYCYCLINE HYCLATE 100 MG PO CAPS: 100.0000 mg | ORAL_CAPSULE | Freq: Two times a day (BID) | ORAL | 0 refills | Status: DC

## 2020-11-19 NOTE — ED Triage Notes (Signed)
Pt c/o abscess on her right buttock. Pt thinks is a spider bite. Started about 6 days ago. She states the area is red, hard and had a dark spot in the middle. She states it started draining today.

## 2020-11-19 NOTE — ED Provider Notes (Signed)
[facility]    CSN: [id number] Arrival date & time: 11/19/20  1250      History   Chief Complaint Chief Complaint  Patient presents with   Abscess   Insect Bite   HPI  50 year old female presents with the above complaint.  Patient reports that this started late last week.  Patient is concerned that she has a "spider bite".  Patient has an area of redness, warmth to her right buttock.  It has drained a little bit today.  Pain is 4/10 in severity.  No fever.  No relieving factors.  She does not recall a bug bite or insect bite.  However, this is her main concern.    Past Medical History:  Diagnosis Date   Basal cell carcinoma 10/21/2014   right upper sternum   Cancer ([place])    skin ca   Family history of adverse reaction to anesthesia    Sister has angioedema and SOB when "-caines" are used.  OK with carbocaine   GERD (gastroesophageal reflux disease)    Past Surgical History:  Procedure Laterality Date   ESOPHAGOGASTRODUODENOSCOPY     ESOPHAGOGASTRODUODENOSCOPY N/A 09/03/2014   Procedure: ESOPHAGOGASTRODUODENOSCOPY (EGD);  Surgeon: Hulen Luster, MD;  Location: [facility];  Service: Gastroenterology;  Laterality: N/A;    OB History   No obstetric history on file.      Home Medications    Prior to Admission medications   Medication Sig Start Date End Date Taking? Authorizing Provider  Calcium Carbonate (CALCIUM 600 PO) Take by mouth.   Yes [provider]  doxycycline (VIBRAMYCIN) 100 MG capsule Take 1 capsule (100 mg total) by mouth 2 (two) times daily. 11/19/20  Yes Coral Spikes, DO  Multiple Vitamin (MULTIVITAMIN) capsule Take 1 capsule by mouth daily.   Yes [provider]  mupirocin ointment (BACTROBAN) 2 % Apply 1 application topically 3 (three) times daily for 7 days. 11/19/20 11/26/20 Yes Mikael Skoda G, DO  omeprazole (PRILOSEC) 20 MG capsule Take 1 capsule by mouth every morning. 07/08/20  Yes [provider]     Family History Family History  Problem Relation Age of Onset   Breast cancer Neg Hx     Social History Social History   Tobacco Use   Smoking status: Never   Smokeless tobacco: Never  Vaping Use   Vaping Use: Never used  Substance Use Topics   Alcohol use: Yes    Alcohol/week: 5.0 standard drinks    Types: 5 Cans of beer per week   Drug use: Not Currently     Allergies   Neosporin [neomycin-bacitracin zn-polymyx]   Review of Systems Review of Systems  Constitutional: Negative.   Skin:        ? Spider bite - right buttock.    Physical Exam Triage Vital Signs ED Triage Vitals  Enc Vitals Group     BP 11/19/20 1323 118/80     Pulse Rate 11/19/20 1323 100     Resp 11/19/20 1323 18     Temp 11/19/20 1323 99.3 F (37.4 C)     Temp Source 11/19/20 1323 Oral     SpO2 11/19/20 1323 97 %     Weight 11/19/20 1324 175 lb 14.8 oz (79.8 kg)     Height 11/19/20 1324 '5\' 9"'$  (1.753 m)     Head Circumference --      Peak Flow --      Pain Score 11/19/20 1324 4  Pain Loc --      Pain Edu? --      Excl. in [place]? --    Updated Vital Signs BP 118/80 (BP Location: Right Arm)   Pulse 100   Temp 99.3 F (37.4 C) (Oral)   Resp 18   Ht '5\' 9"'$  (1.753 m)   Wt 79.8 kg   LMP 09/02/2014   SpO2 97%   BMI 25.98 kg/m   Visual Acuity Right Eye Distance:   Left Eye Distance:   Bilateral Distance:    Right Eye Near:   Left Eye Near:    Bilateral Near:     Physical Exam Vitals and nursing note reviewed. Exam conducted with a chaperone present.  Constitutional:      General: She is not in acute distress.    Appearance: Normal appearance. She is not ill-appearing.  HENT:     Head: Normocephalic and atraumatic.  Eyes:     General:        Right eye: No discharge.        Left eye: No discharge.     Conjunctiva/sclera: Conjunctivae normal.  Pulmonary:     Effort: Pulmonary effort is normal. No respiratory distress.  Skin:    Comments: Right buttock -small area of  induration with a central eschar.  Surrounding erythema that blanches.    Neurological:     Mental Status: She is alert.  Psychiatric:        Mood and Affect: Mood normal.        Behavior: Behavior normal.     UC Treatments / Results  Labs (all labs ordered are listed, but only abnormal results are displayed) Labs Reviewed - No data to display  EKG   Radiology No results found.  Procedures Procedures (including critical care time)  Medications Ordered in UC Medications - No data to display  Initial Impression / Assessment and Plan / UC Course  I have reviewed the triage vital signs and the nursing notes.  Pertinent labs & imaging results that were available during my care of the patient were reviewed by me and considered in my medical decision making (see chart for details).    50 year old female presents with abscess and cellulitis of the right buttock.  No indications for incision and drainage as there is no discrete area of fluctuance.  Treating with Bactroban ointment and doxycycline.  Final Clinical Impressions(s) / UC Diagnoses   Final diagnoses:  Cellulitis and abscess of buttock     Discharge Instructions      Warm soaks/compresses.  Antibiotics as prescribed.  Take care  Dr. Lacinda Axon    ED Prescriptions     Medication Sig Dispense Auth. Provider   mupirocin ointment (BACTROBAN) 2 % Apply 1 application topically 3 (three) times daily for 7 days. 30 g Syon Tews G, DO   doxycycline (VIBRAMYCIN) 100 MG capsule Take 1 capsule (100 mg total) by mouth 2 (two) times daily. 20 capsule Coral Spikes, DO      PDMP not reviewed this encounter.   Coral Spikes, [place] 11/19/20 1446

## 2020-11-19 NOTE — Discharge Instructions (Addendum)
Warm soaks/compresses.  Antibiotics as prescribed.  Take care  Dr. Lacinda Axon

## 2021-11-10 ENCOUNTER — Ambulatory Visit: Payer: Managed Care, Other (non HMO) | Admitting: Dermatology

## 2021-11-16 ENCOUNTER — Other Ambulatory Visit: Payer: Self-pay | Admitting: Physician Assistant

## 2021-11-16 DIAGNOSIS — Z1231 Encounter for screening mammogram for malignant neoplasm of breast: Secondary | ICD-10-CM

## 2021-12-07 ENCOUNTER — Ambulatory Visit
Admission: RE | Admit: 2021-12-07 | Discharge: 2021-12-07 | Disposition: A | Discharge status: Home / Self Care | Payer: Managed Care, Other (non HMO) | Source: Ambulatory Visit | Attending: Physician Assistant | Admitting: Physician Assistant

## 2021-12-07 DIAGNOSIS — Z1231 Encounter for screening mammogram for malignant neoplasm of breast: Secondary | ICD-10-CM | POA: Insufficient documentation

## 2021-12-29 ENCOUNTER — Ambulatory Visit: Payer: Managed Care, Other (non HMO) | Admitting: Dermatology

## 2022-01-19 ENCOUNTER — Ambulatory Visit: Payer: Managed Care, Other (non HMO) | Admitting: Dermatology

## 2022-02-08 ENCOUNTER — Encounter: Payer: Self-pay | Admitting: Dermatology

## 2022-02-08 ENCOUNTER — Ambulatory Visit: Payer: Managed Care, Other (non HMO) | Admitting: Dermatology

## 2022-02-08 DIAGNOSIS — D235 Other benign neoplasm of skin of trunk: Secondary | ICD-10-CM

## 2022-02-08 DIAGNOSIS — L820 Inflamed seborrheic keratosis: Secondary | ICD-10-CM | POA: Diagnosis not present

## 2022-02-08 DIAGNOSIS — Z1283 Encounter for screening for malignant neoplasm of skin: Secondary | ICD-10-CM | POA: Diagnosis not present

## 2022-02-08 DIAGNOSIS — D1801 Hemangioma of skin and subcutaneous tissue: Secondary | ICD-10-CM

## 2022-02-08 DIAGNOSIS — L814 Other melanin hyperpigmentation: Secondary | ICD-10-CM

## 2022-02-08 DIAGNOSIS — L821 Other seborrheic keratosis: Secondary | ICD-10-CM

## 2022-02-08 DIAGNOSIS — L649 Androgenic alopecia, unspecified: Secondary | ICD-10-CM | POA: Diagnosis not present

## 2022-02-08 DIAGNOSIS — L578 Other skin changes due to chronic exposure to nonionizing radiation: Secondary | ICD-10-CM

## 2022-02-08 DIAGNOSIS — Z85828 Personal history of other malignant neoplasm of skin: Secondary | ICD-10-CM

## 2022-02-08 MED ORDER — SPIRONOLACTONE 100 MG PO TABS: 100.0000 mg | ORAL_TABLET | Freq: Every day | ORAL | 3 refills | Status: DC

## 2022-02-08 MED ORDER — MINOXIDIL 2.5 MG PO TABS: 2.5000 mg | ORAL_TABLET | Freq: Every day | ORAL | 2 refills | Status: DC

## 2022-02-08 NOTE — Progress Notes (Signed)
Follow-Up Visit   Subjective  Pamela Colon is a 51 y.o. female who presents for the following: Total body skin exam (Hx of BCC R upper sternum, hx of AKs), check bump (R knee area, ~41yr no symptoms/), check spots (Dry spots, bil sideburns, ~619m and hair thinning (Scalp, 1y83yr Run in family on father's side.  Normal thyroid in past.  She has had gradual hair thinning on the top of her head.  She has not had any itchy rash of the scalp.  She says that she is going through menopause and feels like her hormones are changing which may be contributing to her hair thinning.  The patient presents for Total-Body Skin Exam (TBSE) for skin cancer screening and mole check.  The patient has spots, moles and lesions to be evaluated, some may be new or changing and the patient has concerns that these could be cancer.   The following portions of the chart were reviewed this encounter and updated as appropriate:       Review of Systems:  No other skin or systemic complaints except as noted in HPI or Assessment and Plan.  Objective  Well appearing patient in no apparent distress; mood and affect are within normal limits.  A full examination was performed including scalp, head, eyes, ears, nose, lips, neck, chest, axillae, abdomen, back, buttocks, bilateral upper extremities, bilateral lower extremities, hands, feet, fingers, toes, fingernails, and toenails. All findings within normal limits unless otherwise noted below.  R upper knee x 1 5.0mm32mnk firm pap slightly waxy  L sideburn Stuck on waxy tan papule  L sternal notch x 1 Stuck on waxy papule with erythema   Scalp Diffuse thinning of the crown and widening of the midline part with retention of the frontal hairline - Reviewed progressive nature and prognosis.            Assessment & Plan   Lentigines - Scattered tan macules - Due to sun exposure - Benign-appearing, observe - Recommend daily broad spectrum sunscreen SPF  30+ to sun-exposed areas, reapply every 2 hours as needed. - Call for any changes - face, arms  Seborrheic Keratoses - Stuck-on, waxy, tan-brown papules and/or plaques  - Benign-appearing - Discussed benign etiology and prognosis. 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  - Staying in the shade or wearing long sleeves, sun glasses (UVA+UVB protection) and wide brim hats (4-inch brim around the entire circumference of the hat) are also recommended for sun protection.  - Call for new or changing lesions. - face, chest  Skin cancer screening performed today.   History of Basal Cell Carcinoma of the Skin - No evidence of recurrence today - Recommend regular full body skin exams - Recommend daily broad spectrum sunscreen SPF 30+ to sun-exposed areas, reapply every 2 hours as needed.  - Call if any new or changing lesions are noted between office visits  - upper sternum  Inflamed seborrheic keratosis R upper knee x 1  Vs Irritated Dermatofibroma  Symptomatic, irritating, patient would like treated.   Destruction of lesion - R upper knee x 1  Destruction method: cryotherapy   Informed consent: discussed and consent obtained   Lesion destroyed using liquid nitrogen: Yes   Region frozen until ice ball extended beyond lesion: Yes   Outcome: patient tolerated procedure well with no complications   Post-procedure details: wound care instructions given   Additional details:  Prior to  procedure, discussed risks of blister formation, small wound, skin dyspigmentation, or rare scar following cryotherapy. Recommend Vaseline ointment to treated areas while healing.   Seborrheic keratosis L sideburn  Benign, observe.    Seborrheic keratosis, inflamed L sternal notch x 1  Symptomatic, irritating, patient would like treated.   Destruction of lesion - L sternal notch x 1  Destruction method: cryotherapy   Informed consent: discussed and consent obtained   Lesion destroyed using liquid nitrogen: Yes   Region frozen until ice ball extended beyond lesion: Yes   Outcome: patient tolerated procedure well with no complications   Post-procedure details: wound care instructions given   Additional details:  Prior to procedure, discussed risks of blister formation, small wound, skin dyspigmentation, or rare scar following cryotherapy. Recommend Vaseline ointment to treated areas while healing.   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 Condition is symptomatic/ bothersome to patient. Not currently at goal.   Female Androgenic Alopecia is a chronic condition related to genetics and/or hormonal changes.  In women androgenetic alopecia is commonly associated with menopause but may occur any time after puberty.  It causes hair thinning primarily on the crown with widening of the part and temporal hairline recession.  Can use OTC Rogaine (minoxidil) 5% solution/foam as directed.  Oral treatments in female patients who have no contraindication may include : - Low dose oral minoxidil 1.25 - '5mg'$  daily - Spironolactone 50 - '100mg'$  bid - Finasteride 2.5 - 5 mg daily Adjunctive therapies include: - Low Level Laser Light Therapy (LLLT) - Platelet-rich plasma injections (PRP) - Hair Transplants or scalp reduction   BP today = 158/81  Start Spironolactone '100mg'$  1/2 po qd for a few days, then increase to 1 po qd as  tolerated Start Minoxidil 2.'5mg'$  1/2 tab po qd  Pt will get labs drawn at upcoming PCP visit   Spironolactone can cause increased urination and cause blood pressure to decrease. Please watch for signs of lightheadedness and be cautious when changing position. It can sometimes cause breast tenderness or an irregular period in premenopausal women. It can also increase potassium. The increase in potassium usually is not a concern unless you are taking other medicines that also increase potassium, so please be sure your doctor knows all of the other medications you are taking. This medication should not be taken by pregnant women.  This medicine should also not be taken together with sulfa drugs like Bactrim (trimethoprim/sulfamethexazole).    Doses of minoxidil for hair loss are considered 'low dose'. This is because the doses used for hair loss are a lot lower than the doses which are used for conditions such as high blood pressure (hypertension). The doses used for hypertension are 10-'40mg'$  per day.  Side effects are uncommon at the low doses (up to 2.5 mg/day) used to treat hair loss. Potential side effects, more commonly seen at higher doses, include: Increase in hair growth (hypertrichosis) elsewhere on face and body Temporary hair shedding upon starting medication which may last up to  4 weeks Ankle swelling, fluid retention, rapid weight gain more than 5 pounds Low blood pressure and feeling lightheaded or dizzy when standing up quickly Fast or irregular heartbeat Headaches   Thyroid Panel With TSH - Scalp  CBC (no diff) - Scalp  Ferritin - Scalp  Vitamin D, 25-hydroxy - Scalp  spironolactone (ALDACTONE) 100 MG tablet - Scalp Take 1 tablet (100 mg total) by mouth daily.  minoxidil (LONITEN) 2.5 MG tablet - Scalp Take 1 tablet (2.5 mg total) by mouth daily.   Return in about 3 months (around 05/11/2022) for alopecia.  I, Othelia Pulling, RMA, am acting as scribe for Brendolyn Patty, MD  .  Documentation: I have reviewed the above documentation for accuracy and completeness, and I agree with the above.  Brendolyn Patty MD

## 2022-02-08 NOTE — Patient Instructions (Addendum)
Cryotherapy Aftercare  Wash gently with soap and water everyday.   Apply Vaseline and Band-Aid daily until healed.   Spironolactone '100mg'$  take 1/2 a pill a day for 2 weeks, if not having any problems, can increase to 1 pill a day  Spironolactone can cause increased urination and cause blood pressure to decrease. Please watch for signs of lightheadedness and be cautious when changing position. It can sometimes cause breast tenderness or an irregular period in premenopausal women. It can also increase potassium. The increase in potassium usually is not a concern unless you are taking other medicines that also increase potassium, so please be sure your doctor knows all of the other medications you are taking. This medication should not be taken by pregnant women.  This medicine should also not be taken together with sulfa drugs like Bactrim (trimethoprim/sulfamethexazole).    Minoxidil 2.'5mg'$  take 1/2 a pill a day  Doses of minoxidil for hair loss are considered 'low dose'. This is because the doses used for hair loss are a lot lower than the doses which are used for conditions such as high blood pressure (hypertension). The doses used for hypertension are 10-'40mg'$  per day.  Side effects are uncommon at the low doses (up to 2.5 mg/day) used to treat hair loss. Potential side effects, more commonly seen at higher doses, include: Increase in hair growth (hypertrichosis) elsewhere on face and body Temporary hair shedding upon starting medication which may last up to 4 weeks Ankle swelling, fluid retention, rapid weight gain more than 5 pounds Low blood pressure and feeling lightheaded or dizzy when standing up quickly Fast or irregular heartbeat Headaches    Female Androgenic Alopecia is a chronic condition related to genetics and/or hormonal changes.  In women androgenetic alopecia is commonly associated with menopause but may occur any time after puberty.  It causes hair thinning primarily on the crown  with widening of the part and temporal hairline recession.  Can use OTC Rogaine (minoxidil) 5% solution/foam as directed.  Oral treatments in female patients who have no contraindication may include : - Low dose oral minoxidil 1.25 - '5mg'$  daily - Spironolactone 50 - '100mg'$  bid - Finasteride 2.5 - 5 mg daily Adjunctive therapies include: - Low Level Laser Light Therapy (LLLT) - Platelet-rich plasma injections (PRP) - Hair Transplants or scalp reduction    Basic OTC daily skin care regimen to prevent photoaging:   Recommend facial moisturizer with sunscreen SPF 30 every morning (brands include CeraVe AM, Neutrogena, Eucerin, Cetaphil, Aveeno, La Roche Posay).  Can also apply a topical Vit C serum which is an antioxidant (brands include CeraVe, La Roche Posay, and The Ordinary) underneath sunscreen in morning. If you are outside during the day in the summer for extended periods, especially swimming and/or sweating, make sure you apply a water resistant facial sunscreen lotion spf 30 or higher.   At night recommend a cream with retinol (a vitamin A derivative which stimulates collagen production) like CeraVe skin renewing retinol serum or ROC retinol correxion cream or Neutrogena rapid wrinkle repair cream. Retinol may cause skin irritation in people with sensitive skin.  Can use it every other day and/or apply on top of a hyaluronic acid (HA) moisturizer/serum if better tolerated that way.  Retinol may also help with lightening age spots.   Our office sells high quality, medically tested skin care lines such as Elta MD sunscreens (with Zinc), and Alastin skin care products, which are very effective in treating photoaging. The Alastin line includes  cosmeceutical grade Vit.C serum, HA serum, Elastin stimulating moisturizers/serums, lightening serum, and sunscreens.  If you want prescription treatment, then you would need an appointment (Rx tretinoin and fade creams, Botox, filler injections, laser  treatments, etc.) These prescriptions and procedures are not covered by insurance but work very well.   Due to recent changes in healthcare laws, you may see results of your pathology and/or laboratory studies on MyChart before the doctors have had a chance to review them. We understand that in some cases there may be results that are confusing or concerning to you. Please understand that not all results are received at the same time and often the doctors may need to interpret multiple results in order to provide you with the best plan of care or course of treatment. Therefore, we ask that you please give [place] 2 business days to thoroughly review all your results before contacting the office for clarification. Should we see a critical lab result, you will be contacted sooner.   If You Need Anything After Your Visit  If you have any questions or concerns for your doctor, please call our main line at [phone] and press option 4 to reach your doctor's medical assistant. If no one answers, please leave a voicemail as directed and we will return your call as soon as possible. Messages left after 4 pm will be answered the following business day.   You may also send [place] a message via [place]. We typically respond to MyChart messages within 1-2 business days.  For prescription refills, please ask your pharmacy to contact our office. Our fax number is [phone].  If you have an urgent issue when the clinic is closed that cannot wait until the next business day, you can page your doctor at the number below.    Please note that while we do our best to be available for urgent issues outside of office hours, we are not available 24/7.   If you have an urgent issue and are unable to reach [place], you may choose to seek medical care at your doctor's office, retail clinic, urgent care center, or emergency room.  If you have a medical emergency, please immediately call 911 or go to the emergency  department.  Pager Numbers  - Dr. Nehemiah Massed: [phone]  - Dr. Laurence Ferrari: [phone]  - Dr. Nicole Kindred: [phone]  In the event of inclement weather, please call our main line at [phone] for an update on the status of any delays or closures.  Dermatology Medication Tips: Please keep the boxes that topical medications come in in order to help keep track of the instructions about where and how to use these. Pharmacies typically print the medication instructions only on the boxes and not directly on the medication tubes.   If your medication is too expensive, please contact our office at [phone] option 4 or send [place] a message through [place].   We are unable to tell what your co-pay for medications will be in advance as this is different depending on your insurance coverage. However, we may be able to find a substitute medication at lower cost or fill out paperwork to get insurance to cover a needed medication.   If a prior authorization is required to get your medication covered by your insurance company, please allow [place] 1-2 business days to complete this process.  Drug prices often vary depending on where the prescription is filled and some pharmacies may offer cheaper prices.  The website www.goodrx.com contains coupons for medications through  different pharmacies. The prices here do not account for what the cost may be with help from insurance (it may be cheaper with your insurance), but the website can give you the price if you did not use any insurance.  - You can print the associated coupon and take it with your prescription to the pharmacy.  - You may also stop by our office during regular business hours and pick up a GoodRx coupon card.  - If you need your prescription sent electronically to a different pharmacy, notify our office through [facility] or by phone at [phone] option 4.     Si Usted Necesita Algo Despus de Su Visita  Tambin puede enviarnos un  mensaje a travs de Pharmacist, community. Por lo general respondemos a los mensajes de MyChart en el transcurso de 1 a 2 das hbiles.  Para renovar recetas, por favor pida a su farmacia que se ponga en contacto con nuestra oficina. Harland Dingwall de fax es [place] [phone].  Si tiene un asunto urgente cuando la clnica est cerrada y que no puede esperar hasta el siguiente da hbil, puede llamar/localizar a su doctor(a) al nmero que aparece a continuacin.   Por favor, tenga en cuenta que aunque hacemos todo lo posible para estar disponibles para asuntos urgentes fuera del horario de [place], no estamos disponibles las 24 horas del da, los 7 das de la [place].   Si tiene un problema urgente y no puede comunicarse con nosotros, puede optar por buscar atencin mdica  en el consultorio de su doctor(a), en una clnica privada, en un centro de atencin urgente o en una sala de emergencias.  Si tiene Engineering geologist, por favor llame inmediatamente al 911 o vaya a la sala de emergencias.  Nmeros de bper  - Dr. Nehemiah Massed: [phone]  - Dra. Moye: [phone]  - Dra. Nicole Kindred: [phone]  En caso de inclemencias del [place], por favor llame a Johnsie Kindred principal al [phone] para una actualizacin sobre el [place] de cualquier retraso o cierre.  Consejos para la medicacin en dermatologa: Por favor, guarde las cajas en las que vienen los medicamentos de uso tpico para ayudarle a seguir las instrucciones sobre dnde y cmo usarlos. Las farmacias generalmente imprimen las instrucciones del medicamento slo en las cajas y no directamente en los tubos del [place].   Si su medicamento es muy caro, por favor, pngase en contacto con Zigmund Daniel llamando al [phone] y presione la opcin 4 o envenos un mensaje a travs de Pharmacist, community.   No podemos decirle cul ser su copago por los medicamentos por adelantado ya que esto es diferente dependiendo de la cobertura de su seguro. Sin embargo,  es posible que podamos encontrar un medicamento sustituto a Electrical engineer un formulario para que el seguro cubra el medicamento que se considera necesario.   Si se requiere una autorizacin previa para que su compaa de seguros [place] su medicamento, por favor permtanos de 1 a 2 das hbiles para completar este proceso.  Los precios de los medicamentos varan con frecuencia dependiendo del Environmental consultant de dnde se surte la receta y alguna farmacias pueden ofrecer precios ms baratos.  El sitio web www.goodrx.com tiene cupones para medicamentos de Airline pilot. Los precios aqu no tienen en cuenta lo que podra costar con la ayuda del seguro (puede ser ms barato con su seguro), pero el sitio web puede darle el precio si no utiliz Research scientist (physical sciences).  - Puede imprimir el cupn correspondiente y llevarlo con  su receta a la farmacia.  - Tambin puede pasar por nuestra oficina durante el horario de atencin regular y Charity fundraiser una tarjeta de cupones de GoodRx.  - Si necesita que su receta se enve electrnicamente a una farmacia diferente, informe a nuestra oficina a travs de MyChart de [place] o por telfono llamando al [phone] y presione la opcin 4.

## 2022-05-16 ENCOUNTER — Other Ambulatory Visit: Payer: Self-pay | Admitting: Dermatology

## 2022-05-16 DIAGNOSIS — L649 Androgenic alopecia, unspecified: Secondary | ICD-10-CM

## 2022-05-17 ENCOUNTER — Ambulatory Visit: Payer: Managed Care, Other (non HMO) | Admitting: Dermatology

## 2022-06-02 ENCOUNTER — Encounter: Payer: Self-pay | Admitting: Dermatology

## 2022-06-02 ENCOUNTER — Ambulatory Visit (INDEPENDENT_AMBULATORY_CARE_PROVIDER_SITE_OTHER): Payer: Managed Care, Other (non HMO) | Admitting: Dermatology

## 2022-06-02 VITALS — BP 129/83 | HR 78

## 2022-06-02 DIAGNOSIS — L649 Androgenic alopecia, unspecified: Secondary | ICD-10-CM | POA: Diagnosis not present

## 2022-06-02 DIAGNOSIS — D239 Other benign neoplasm of skin, unspecified: Secondary | ICD-10-CM

## 2022-06-02 DIAGNOSIS — D2371 Other benign neoplasm of skin of right lower limb, including hip: Secondary | ICD-10-CM

## 2022-06-02 NOTE — Patient Instructions (Addendum)
Recommend re-starting Minoxidil 2.5 mg 1/2 tablet daily. If tolerating well by 4 weeks. can increase to 1 tablet daily. Stop if notice side effects.  Doses of minoxidil for hair loss are considered 'low dose'. This is because the doses used for hair loss are much lower than the doses which are used for conditions such as high blood pressure (hypertension). The doses used for hypertension are 10-53m per day.  Side effects are uncommon at the low doses (up to 2.5 mg/day) used to treat hair loss. Potential side effects, more commonly seen at higher doses, include: Increase in hair growth (hypertrichosis) elsewhere on face and body Temporary hair shedding upon starting medication which may last up to 4 weeks Ankle swelling, fluid retention, rapid weight gain more than 5 pounds Low blood pressure and feeling lightheaded or dizzy when standing up quickly Fast or irregular heartbeat Headaches  If cannot tolerate oral Minoxidil can try OTC Minoxidil 5% topical.    A dermatofibroma is a benign growth possibly related to trauma, such as an insect bite, cut from shaving, or inflamed acne-type bump.  Treatment options to remove include shave or excision with resulting scar and risk of recurrence.  Since not bothersome, will observe for now.   Due to recent changes in healthcare laws, you may see results of your pathology and/or laboratory studies on MyChart before the doctors have had a chance to review them. We understand that in some cases there may be results that are confusing or concerning to you. Please understand that not all results are received at the same time and often the doctors may need to interpret multiple results in order to provide you with the best plan of care or course of treatment. Therefore, we ask that you please give u[place]2 business days to thoroughly review all your results before contacting the office for clarification. Should we see a critical lab result, you will be contacted  sooner.   If You Need Anything After Your Visit  If you have any questions or concerns for your doctor, please call our main line at [phone] press option 4 to reach your doctor's medical assistant. If no one answers, please leave a voicemail as directed and we will return your call as soon as possible. Messages left after 4 pm will be answered the following business day.   You may also send [place] message via [place] We typically respond to MyChart messages within 1-2 business days.  For prescription refills, please ask your pharmacy to contact our office. Our fax number is [phone]  If you have an urgent issue when the clinic is closed that cannot wait until the next business day, you can page your doctor at the number below.    Please note that while we do our best to be available for urgent issues outside of office hours, we are not available 24/7.   If you have an urgent issue and are unable to reach [place] you may choose to seek medical care at your doctor's office, retail clinic, urgent care center, or emergency room.  If you have a medical emergency, please immediately call 911 or go to the emergency department.  Pager Numbers  - Dr. KNehemiah Massed [phone] - Dr. MLaurence Ferrari [phone] - Dr. SNicole Kindred [phone] In the event of inclement weather, please call our main line at [phone] an update on the status of any delays or closures.  Dermatology Medication Tips: Please keep the boxes that topical medications come in  in order to help keep track of the instructions about where and how to use these. Pharmacies typically print the medication instructions only on the boxes and not directly on the medication tubes.   If your medication is too expensive, please contact our office at [phone] option 4 or send [place] a message through [place].   We are unable to tell what your co-pay for medications will be in advance as this is different depending on your insurance  coverage. However, we may be able to find a substitute medication at lower cost or fill out paperwork to get insurance to cover a needed medication.   If a prior authorization is required to get your medication covered by your insurance company, please allow [place] 1-2 business days to complete this process.  Drug prices often vary depending on where the prescription is filled and some pharmacies may offer cheaper prices.  The website www.goodrx.com contains coupons for medications through different pharmacies. The prices here do not account for what the cost may be with help from insurance (it may be cheaper with your insurance), but the website can give you the price if you did not use any insurance.  - You can print the associated coupon and take it with your prescription to the pharmacy.  - You may also stop by our office during regular business hours and pick up a GoodRx coupon card.  - If you need your prescription sent electronically to a different pharmacy, notify our office through [facility] or by phone at [phone] option 4.     Si Usted Necesita Algo Despus de Su Visita  Tambin puede enviarnos un mensaje a travs de Pharmacist, community. Por lo general respondemos a los mensajes de MyChart en el transcurso de 1 a 2 das hbiles.  Para renovar recetas, por favor pida a su farmacia que se ponga en contacto con nuestra oficina. Harland Dingwall de fax es [place] [phone].  Si tiene un asunto urgente cuando la clnica est cerrada y que no puede esperar hasta el siguiente da hbil, puede llamar/localizar a su doctor(a) al nmero que aparece a continuacin.   Por favor, tenga en cuenta que aunque hacemos todo lo posible para estar disponibles para asuntos urgentes fuera del horario de [place], no estamos disponibles las 24 horas del da, los 7 das de la [place].   Si tiene un problema urgente y no puede comunicarse con nosotros, puede optar por buscar atencin mdica  en el consultorio de  su doctor(a), en una clnica privada, en un centro de atencin urgente o en una sala de emergencias.  Si tiene Engineering geologist, por favor llame inmediatamente al 911 o vaya a la sala de emergencias.  Nmeros de bper  - Dr. Nehemiah Massed: [phone]  - Dra. Moye: [phone]  - Dra. Nicole Kindred: [phone]  En caso de inclemencias del [place], por favor llame a Johnsie Kindred principal al [phone] para una actualizacin sobre el [place] de cualquier retraso o cierre.  Consejos para la medicacin en dermatologa: Por favor, guarde las cajas en las que vienen los medicamentos de uso tpico para ayudarle a seguir las instrucciones sobre dnde y cmo usarlos. Las farmacias generalmente imprimen las instrucciones del medicamento slo en las cajas y no directamente en los tubos del [place].   Si su medicamento es muy caro, por favor, pngase en contacto con Zigmund Daniel llamando al [phone] y presione la opcin 4 o envenos un mensaje a travs de Pharmacist, community.   No podemos decirle cul ser  su copago por los medicamentos por adelantado ya que esto es diferente dependiendo de la cobertura de su seguro. Sin embargo, es posible que podamos encontrar un medicamento sustituto a Electrical engineer un formulario para que el seguro cubra el medicamento que se considera necesario.   Si se requiere una autorizacin previa para que su compaa de seguros [place] su medicamento, por favor permtanos de 1 a 2 das hbiles para completar este proceso.  Los precios de los medicamentos varan con frecuencia dependiendo del Environmental consultant de dnde se surte la receta y alguna farmacias pueden ofrecer precios ms baratos.  El sitio web www.goodrx.com tiene cupones para medicamentos de Airline pilot. Los precios aqu no tienen en cuenta lo que podra costar con la ayuda del seguro (puede ser ms barato con su seguro), pero el sitio web puede darle el precio si no utiliz Research scientist (physical sciences).  - Puede imprimir el  cupn correspondiente y llevarlo con su receta a la farmacia.  - Tambin puede pasar por nuestra oficina durante el horario de atencin regular y Charity fundraiser una tarjeta de cupones de GoodRx.  - Si necesita que su receta se enve electrnicamente a una farmacia diferente, informe a nuestra oficina a travs de MyChart de  o por telfono llamando al [phone] y presione la opcin 4.

## 2022-06-02 NOTE — Progress Notes (Signed)
   Follow-Up Visit   Subjective  Pamela Colon is a 52 y.o. female who presents for the following: Alopecia (3 month follow up. Took Spironolactone 100 mg daily and Minoxidil 2.5 mg 1/2 tablet daily. Was getting dizzy spells and racing heartbeat so she weaned herself off the medications. 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 Was treated at last visit with LN2, has not resolved).    The following portions of the chart were reviewed this encounter and updated as appropriate:      Review of Systems: No other skin or systemic complaints except as noted in HPI or Assessment and Plan.   Objective  Well appearing patient in no apparent distress; mood and affect are within normal limits.  A focused examination was performed including scalp, right thigh. Relevant physical exam findings are noted in the Assessment and Plan.  Scalp Diffuse thinning of the crown and widening of the midline part with retention of the frontal hairline - Reviewed progressive nature and prognosis. 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 Condition is symptomatic/ bothersome to patient. Not currently at goal.  Recommend re-starting Minoxidil 2.5 mg 1/2 tablet daily. If tolerating well by 4 weeks without side effects, can increase to 1 tablet daily, if not able to tolerate higher dose then stay at 1/2 tab D/C Spironolactone due to side effects.  Doses of minoxidil for hair loss are considered 'low dose'. This is because the doses used for hair loss are much lower than the doses which are used for conditions such as high blood pressure (hypertension). The doses used for hypertension are 10-20m  per day.  Side effects are uncommon at the low doses (up to 2.5 mg/day) used to treat hair loss. Potential side effects, more commonly seen at higher doses, include: Increase in hair growth (hypertrichosis) elsewhere on face and body Temporary hair shedding upon starting medication which may last up to 4 weeks Ankle swelling, fluid retention, rapid weight gain more than 5 pounds Low blood pressure and feeling lightheaded or dizzy when standing up quickly Fast or irregular heartbeat Headaches   Related Medications spironolactone (ALDACTONE) 100 MG tablet Take 1 tablet (100 mg total) by mouth daily.  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  Asymptomatic.  Benign, observe.    A dermatofibroma is a benign growth possibly related to trauma, such as an insect bite, cut from shaving, or inflamed acne-type bump.  Treatment options to remove include shave or excision with resulting scar and risk of recurrence.  Since not bothersome, will observe for now.    Return in about 9 months (around 03/03/2023) for TBSE.  I, JEmelia Salisbury CMA, am acting as scribe for TBrendolyn Patty MD.  Documentation: I have reviewed the above documentation for accuracy and completeness, and I agree with the above.  TBrendolyn PattyMD

## 2022-10-30 ENCOUNTER — Other Ambulatory Visit: Payer: Self-pay | Admitting: Dermatology

## 2022-10-30 DIAGNOSIS — L649 Androgenic alopecia, unspecified: Secondary | ICD-10-CM

## 2023-02-28 ENCOUNTER — Encounter: Payer: Managed Care, Other (non HMO) | Admitting: Dermatology

## 2023-03-07 ENCOUNTER — Ambulatory Visit: Payer: Managed Care, Other (non HMO) | Admitting: Dermatology

## 2023-03-07 DIAGNOSIS — W908XXA Exposure to other nonionizing radiation, initial encounter: Secondary | ICD-10-CM

## 2023-03-07 DIAGNOSIS — L578 Other skin changes due to chronic exposure to nonionizing radiation: Secondary | ICD-10-CM | POA: Diagnosis not present

## 2023-03-07 DIAGNOSIS — Z79899 Other long term (current) drug therapy: Secondary | ICD-10-CM

## 2023-03-07 DIAGNOSIS — D1801 Hemangioma of skin and subcutaneous tissue: Secondary | ICD-10-CM

## 2023-03-07 DIAGNOSIS — Z85828 Personal history of other malignant neoplasm of skin: Secondary | ICD-10-CM

## 2023-03-07 DIAGNOSIS — L821 Other seborrheic keratosis: Secondary | ICD-10-CM

## 2023-03-07 DIAGNOSIS — D229 Melanocytic nevi, unspecified: Secondary | ICD-10-CM

## 2023-03-07 DIAGNOSIS — Z1283 Encounter for screening for malignant neoplasm of skin: Secondary | ICD-10-CM

## 2023-03-07 DIAGNOSIS — L814 Other melanin hyperpigmentation: Secondary | ICD-10-CM

## 2023-03-07 DIAGNOSIS — L649 Androgenic alopecia, unspecified: Secondary | ICD-10-CM

## 2023-03-07 DIAGNOSIS — D225 Melanocytic nevi of trunk: Secondary | ICD-10-CM

## 2023-03-07 MED ORDER — FINASTERIDE 5 MG PO TABS: 5.0000 mg | ORAL_TABLET | Freq: Every day | ORAL | 5 refills | Status: AC

## 2023-03-07 NOTE — Patient Instructions (Addendum)
Start Finasteride 5 MG take 1/2 tablet by mouth daily.   Recommend minoxidil 5% (Rogaine for men) solution or foam to be applied to the scalp and left in. This should ideally be used twice daily for best results but it helps with hair regrowth when used at least three times per week. Rogaine initially can cause increased hair shedding for the first few weeks but this will stop with continued use. In studies, people who used minoxidil (Rogaine) for at least 6 months had thicker hair than people who did not. Minoxidil topical (Rogaine) only works as long as it continues to be used. If if it is no longer used then the hair it has been helping to regrow can fall out. Minoxidil topical (Rogaine) can cause increased facial hair growth which can usually be managed easily with a battery-operated hair trimmer. If facial hair growth is bothersome, switching to the 2% women's version can decrease the risk of unwanted facial hair growth.   Melanoma ABCDEs  Melanoma is the most dangerous type of skin cancer, and is the leading cause of death from skin disease.  You are more likely to develop melanoma if you: Have light-colored skin, light-colored eyes, or red or blond hair Spend a lot of time in the sun Tan regularly, either outdoors or in a tanning bed Have had blistering sunburns, especially during childhood Have a close family member who has had a melanoma Have atypical moles or large birthmarks  Early detection of melanoma is key since treatment is typically straightforward and cure rates are extremely high if we catch it early.   The first sign of melanoma is often a change in a mole or a new dark spot.  The ABCDE system is a way of remembering the signs of melanoma.  A for asymmetry:  The two halves do not match. B for border:  The edges of the growth are irregular. C for color:  A mixture of colors are present instead of an even brown color. D for diameter:  Melanomas are usually (but not always)  greater than 6mm - the size of a pencil eraser. E for evolution:  The spot keeps changing in size, shape, and color.  Please check your skin once per month between visits. You can use a small mirror in front and a large mirror behind you to keep an eye on the back side or your body.   If you see any new or changing lesions before your next follow-up, please call to schedule a visit.  Please continue daily skin protection including broad spectrum sunscreen SPF 30+ to sun-exposed areas, reapplying every 2 hours as needed when you're outdoors.   Staying in the shade or wearing long sleeves, sun glasses (UVA+UVB protection) and wide brim hats (4-inch brim around the entire circumference of the hat) are also recommended for sun protection.     Due to recent changes in healthcare laws, you may see results of your pathology and/or laboratory studies on MyChart before the doctors have had a chance to review them. We understand that in some cases there may be results that are confusing or concerning to you. Please understand that not all results are received at the same time and often the doctors may need to interpret multiple results in order to provide you with the best plan of care or course of treatment. Therefore, we ask that you please give [place] 2 business days to thoroughly review all your results before contacting the office for clarification. Should  we see a critical lab result, you will be contacted sooner.   If You Need Anything After Your Visit  If you have any questions or concerns for your doctor, please call our main line at [phone] and press option 4 to reach your doctor's medical assistant. If no one answers, please leave a voicemail as directed and we will return your call as soon as possible. Messages left after 4 pm will be answered the following business day.   You may also send [place] a message via MyChart. We typically respond to MyChart messages within 1-2 business days.  For  prescription refills, please ask your pharmacy to contact our office. Our fax number is [phone].  If you have an urgent issue when the clinic is closed that cannot wait until the next business day, you can page your doctor at the number below.    Please note that while we do our best to be available for urgent issues outside of office hours, we are not available 24/7.   If you have an urgent issue and are unable to reach [place], you may choose to seek medical care at your doctor's office, retail clinic, urgent care center, or emergency room.  If you have a medical emergency, please immediately call 911 or go to the emergency department.  Pager Numbers  - Dr. Gwen Pounds: [phone]  - Dr. Roseanne Reno: [phone]  - Dr. Katrinka Blazing: [phone]   In the event of inclement weather, please call our main line at [phone] for an update on the status of any delays or closures.  Dermatology Medication Tips: Please keep the boxes that topical medications come in in order to help keep track of the instructions about where and how to use these. Pharmacies typically print the medication instructions only on the boxes and not directly on the medication tubes.   If your medication is too expensive, please contact our office at [phone] option 4 or send [place] a message through MyChart.   We are unable to tell what your co-pay for medications will be in advance as this is different depending on your insurance coverage. However, we may be able to find a substitute medication at lower cost or fill out paperwork to get insurance to cover a needed medication.   If a prior authorization is required to get your medication covered by your insurance company, please allow [place] 1-2 business days to complete this process.  Drug prices often vary depending on where the prescription is filled and some pharmacies may offer cheaper prices.  The website www.goodrx.com contains coupons for medications through different  pharmacies. The prices here do not account for what the cost may be with help from insurance (it may be cheaper with your insurance), but the website can give you the price if you did not use any insurance.  - You can print the associated coupon and take it with your prescription to the pharmacy.  - You may also stop by our office during regular business hours and pick up a GoodRx coupon card.  - If you need your prescription sent electronically to a different pharmacy, notify our office through [facility] or by phone at [phone] option 4.     Si Usted Necesita Algo Despus de Su Visita  Tambin puede enviarnos un mensaje a travs de Clinical cytogeneticist. Por lo general respondemos a los mensajes de MyChart en el transcurso de 1 a 2 das hbiles.  Para renovar recetas, por favor pida a su farmacia que se ponga  en contacto con nuestra oficina. Annie Sable de fax es [place] [phone].  Si tiene un asunto urgente cuando la clnica est cerrada y que no puede esperar hasta el siguiente da hbil, puede llamar/localizar a su doctor(a) al nmero que aparece a continuacin.   Por favor, tenga en cuenta que aunque hacemos todo lo posible para estar disponibles para asuntos urgentes fuera del horario de [place], no estamos disponibles las 24 horas del da, los 7 [address]  [address] de la [place].   Si tiene un problema urgente y no puede comunicarse con nosotros, puede optar por buscar atencin mdica  en el consultorio de su doctor(a), en una clnica privada, en un centro de atencin urgente o en una sala de emergencias.  Si tiene Engineer, drilling, por favor llame inmediatamente al 911 o vaya a la sala de emergencias.  Nmeros de bper  - Dr. Gwen Pounds: [phone]  - Dra. Roseanne Reno: [phone]  - Dr. Katrinka Blazing: [phone]   En caso de inclemencias del tiempo, por favor llame a Lacy Duverney principal al [phone] para una actualizacin sobre el [place] de cualquier retraso o cierre.  Consejos para la  medicacin en dermatologa: Por favor, guarde las cajas en las que vienen los medicamentos de uso tpico para ayudarle a seguir las instrucciones sobre dnde y cmo usarlos. Las farmacias generalmente imprimen las instrucciones del medicamento slo en las cajas y no directamente en los tubos del [place].   Si su medicamento es muy caro, por favor, pngase en contacto con Rolm Gala llamando al [phone] y presione la opcin 4 o envenos un mensaje a travs de Clinical cytogeneticist.   No podemos decirle cul ser su copago por los medicamentos por adelantado ya que esto es diferente dependiendo de la cobertura de su seguro. Sin embargo, es posible que podamos encontrar un medicamento sustituto a Audiological scientist un formulario para que el seguro cubra el medicamento que se considera necesario.   Si se requiere una autorizacin previa para que su compaa de seguros [place] su medicamento, por favor permtanos de 1 a 2 das hbiles para completar [address]  Los precios de los medicamentos varan con frecuencia dependiendo del Environmental consultant de dnde se surte la receta y alguna farmacias pueden ofrecer precios ms baratos.  El sitio web www.goodrx.com tiene cupones para medicamentos de Health and safety inspector. Los precios aqu no tienen en cuenta lo que podra costar con la ayuda del seguro (puede ser ms barato con su seguro), pero el sitio web puede darle el precio si no utiliz Tourist information centre manager.  - Puede imprimir el cupn correspondiente y llevarlo con su receta a la farmacia.  - Tambin puede pasar por nuestra oficina durante el horario de atencin regular y Education officer, museum una tarjeta de cupones de GoodRx.  - Si necesita que su receta se enve electrnicamente a una farmacia diferente, informe a nuestra oficina a travs de MyChart de [place] o por telfono llamando al [phone] y presione la opcin 4.

## 2023-03-07 NOTE — Progress Notes (Signed)
Follow-Up Visit   Subjective  Pamela Colon is a 52 y.o. female who presents for the following: Skin Cancer Screening and Full Body Skin Exam  The patient presents for Total-Body Skin Exam (TBSE) for skin cancer screening and mole check. The patient has spots, moles and lesions to be evaluated, some may be new or changing and the patient may have concern these could be cancer. She has a history of BCC of the right upper sternum.    The following portions of the chart were reviewed this encounter and updated as appropriate: medications, allergies, medical history  Review of Systems:  No other skin or systemic complaints except as noted in HPI or Assessment and Plan.  Objective  Well appearing patient in no apparent distress; mood and affect are within normal limits.  A full examination was performed including scalp, head, eyes, ears, nose, lips, neck, chest, axillae, abdomen, back, buttocks, bilateral upper extremities, bilateral lower extremities, hands, feet, fingers, toes, fingernails, and toenails. All findings within normal limits unless otherwise noted below.   Relevant physical exam findings are noted in the Assessment and Plan.    Assessment & Plan   SKIN CANCER SCREENING PERFORMED TODAY.  ACTINIC DAMAGE - Chronic condition, secondary to cumulative UV/sun exposure - diffuse scaly erythematous macules with underlying dyspigmentation - Recommend daily broad spectrum sunscreen SPF 30+ to sun-exposed areas, reapply every 2 hours as needed.  - Staying in the shade or wearing long sleeves, sun glasses (UVA+UVB protection) and wide brim hats (4-inch brim around the entire circumference of the hat) are also recommended for sun protection.  - Call for new or changing lesions.  LENTIGINES, SEBORRHEIC KERATOSES, HEMANGIOMAS - Benign normal skin lesions - Benign-appearing - Call for any changes  MELANOCYTIC NEVI - Tan-brown and/or pink-flesh-colored symmetric macules and  papules - R mid back at bra line, 7.0 x 4.0 mm med brown macule - Benign appearing on exam today - Observation - Call clinic for new or changing moles - Recommend daily use of broad spectrum spf 30+ sunscreen to sun-exposed areas.   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  - Call if any new or changing lesions are noted between office visits   ANDROGENETIC ALOPECIA (FEMALE PATTERN HAIR LOSS) Exam: Diffuse thinning of the crown and widening of the midline part with retention of the frontal hairline.  Photos compared, slightly improved from baseline, but has worsened since she stopped the minoxidil 2 mons ago.  Chronic and persistent condition with duration or expected duration over one year. Condition is symptomatic/ bothersome to patient. Not currently at goal.   Female Androgenic Alopecia is a chronic condition related to genetics and/or hormonal changes.  In women androgenetic alopecia is commonly associated with menopause but may occur any time after puberty.  It causes hair thinning primarily on the crown with widening of the part and temporal hairline recession.  Can use OTC Rogaine (minoxidil) 5% solution/foam as directed.  Oral treatments in female patients who have no contraindication may include : - Low dose oral minoxidil 1.25 - 5mg  daily - Spironolactone 50 - 100mg  bid - Finasteride 2.5 - 5 mg daily Adjunctive therapies  include: - Low Level Laser Light Therapy (LLLT) - Platelet-rich plasma injections (PRP) - Hair Transplants or scalp reduction   Treatment Plan: Patient d/c Minoxidil. Didn't like the way it made her feel- heart racing Patient  taking Biotin and will start Rogaine 5% topical qhs  Start finasteride 5 MG take 1/2 tablet daily dsp #30 3Rf. No history of breast cancer.   Long term medication management.  Patient is using long term (months to years) prescription medication  to control their dermatologic condition.  These medications require periodic monitoring to evaluate for efficacy and side effects and may require periodic laboratory monitoring.    Return in about 1 year (around 03/06/2024) for TBSE.  ICherlyn Labella, CMA, am acting as scribe for Willeen Niece, MD .   Documentation: I have reviewed the above documentation for accuracy and completeness, and I agree with the above.  Willeen Niece, MD

## 2023-03-22 ENCOUNTER — Other Ambulatory Visit: Payer: Self-pay | Admitting: Obstetrics & Gynecology

## 2023-03-22 DIAGNOSIS — Z1231 Encounter for screening mammogram for malignant neoplasm of breast: Secondary | ICD-10-CM

## 2023-03-23 ENCOUNTER — Ambulatory Visit
Admission: RE | Admit: 2023-03-23 | Discharge: 2023-03-23 | Disposition: A | Discharge status: Home / Self Care | Payer: Managed Care, Other (non HMO) | Source: Ambulatory Visit | Attending: Obstetrics & Gynecology | Admitting: Obstetrics & Gynecology

## 2023-03-23 DIAGNOSIS — Z1231 Encounter for screening mammogram for malignant neoplasm of breast: Secondary | ICD-10-CM | POA: Diagnosis present

## 2023-07-15 ENCOUNTER — Ambulatory Visit (INDEPENDENT_AMBULATORY_CARE_PROVIDER_SITE_OTHER)

## 2023-07-15 ENCOUNTER — Encounter: Payer: Self-pay | Admitting: Emergency Medicine

## 2023-07-15 ENCOUNTER — Ambulatory Visit
Admission: EM | Admit: 2023-07-15 | Discharge: 2023-07-15 | Disposition: A | Discharge status: Home / Self Care | Attending: Family Medicine | Admitting: Family Medicine

## 2023-07-15 DIAGNOSIS — R051 Acute cough: Secondary | ICD-10-CM | POA: Insufficient documentation

## 2023-07-15 DIAGNOSIS — J069 Acute upper respiratory infection, unspecified: Secondary | ICD-10-CM | POA: Insufficient documentation

## 2023-07-15 LAB — GROUP A STREP BY PCR: Group A Strep by PCR: NOT DETECTED

## 2023-07-15 LAB — SARS CORONAVIRUS 2 BY RT PCR: SARS Coronavirus 2 by RT PCR: NEGATIVE

## 2023-07-15 MED ORDER — ALBUTEROL SULFATE HFA 108 (90 BASE) MCG/ACT IN AERS: 2.0000 | INHALATION_SPRAY | RESPIRATORY_TRACT | 0 refills | Status: AC | PRN

## 2023-07-15 MED ORDER — PROMETHAZINE-DM 6.25-15 MG/5ML PO SYRP: 5.0000 mL | ORAL_SOLUTION | Freq: Four times a day (QID) | ORAL | 0 refills | Status: AC | PRN

## 2023-07-15 NOTE — Discharge Instructions (Addendum)
 Strep test is negative. The lab has to rerun your COVID test. I will call you if this is positive. Stop by the pharmacy to pick up your prescriptions.  Follow up with your primary care provider or return to the urgent care, if not improving.   Your chest xray did not show evidence of pneumonia or bronchitis though the radiologist has not yet read it. If they find something that I didn't, I will call you.    You will see your results in MyChart.

## 2023-07-15 NOTE — ED Triage Notes (Signed)
 Patient c/o cough, chest congestion, sore throat and sinus pressure that started on Tuesday.  Patient denies fevers.

## 2023-07-15 NOTE — ED Provider Notes (Signed)
 [facility]    CSN: [id number] Arrival date & time: 07/15/23  1531      History   Chief Complaint Chief Complaint  Patient presents with   Cough   Sore Throat    HPI Ilo Beamon Schlauch is a 53 y.o. female.   HPI  History obtained from the patient. Kanya presents for cough, sore throat, ear pain ,jaw pain, dental pain, headache that started on Tuesday. Symptoms are getting worse instead of better. Took some Claritin, Allegra, Flonase and Nasacort.  No known sick contacts. Denies fever, vomiting, diarrhea. Home COVID test yesterday was negative. She was around a lot of people at her job but known sick contacts at work.     Past Medical History:  Diagnosis Date   Actinic keratosis    Basal cell carcinoma 10/21/2014   right upper sternum   Cancer (HCC)    skin ca   Family history of adverse reaction to anesthesia    Sister has angioedema and SOB when "-caines" are used.  OK with carbocaine   GERD (gastroesophageal reflux disease)     There are no active problems to display for this patient.   Past Surgical History:  Procedure Laterality Date   ESOPHAGOGASTRODUODENOSCOPY     ESOPHAGOGASTRODUODENOSCOPY N/A 09/03/2014   Procedure: ESOPHAGOGASTRODUODENOSCOPY (EGD);  Surgeon: Wallace Cullens, MD;  [facility];  Service: Gastroenterology;  Laterality: N/A;    OB History   No obstetric history on file.      Home Medications    Prior to Admission medications   Medication Sig Start Date End Date Taking? Authorizing Provider  albuterol (VENTOLIN HFA) 108 (90 Base) MCG/ACT inhaler Inhale 2 puffs into the lungs every 4 (four) hours as needed. 07/15/23  Yes Loistine Eberlin, DO  promethazine-dextromethorphan (PROMETHAZINE-DM) 6.25-15 MG/5ML syrup Take 5 mLs by mouth 4 (four) times daily as needed. 07/15/23  Yes Eiko Mcgowen, DO  Calcium Carbonate (CALCIUM 600 PO) Take by mouth.    [provider]  finasteride (PROSCAR) 5 MG tablet Take 1  tablet (5 mg total) by mouth daily. 03/07/23   Willeen Niece, MD  Multiple Vitamin (MULTIVITAMIN) capsule Take 1 capsule by mouth daily.    [provider]  omeprazole (PRILOSEC) 20 MG capsule Take 1 capsule by mouth every morning. 07/08/20   [provider]    Family History Family History  Problem Relation Age of Onset   Breast cancer Neg Hx     Social History Social History   Tobacco Use   Smoking status: Never   Smokeless tobacco: Never  Vaping Use   Vaping status: Never Used  Substance Use Topics   Alcohol use: Yes    Alcohol/week: 5.0 standard drinks of alcohol    Types: 5 Cans of beer per week   Drug use: Not Currently     Allergies   Amoxicillin and Neosporin [neomycin-bacitracin zn-polymyx]   Review of Systems Review of Systems: negative unless otherwise stated in HPI.      Physical Exam Triage Vital Signs ED Triage Vitals  Encounter Vitals Group     BP 07/15/23 1544 129/78     Systolic BP Percentile --      Diastolic BP Percentile --      Pulse Rate 07/15/23 1544 96     Resp 07/15/23 1544 14     Temp 07/15/23 1544 99.1 F (37.3 C)     Temp Source 07/15/23 1544 Oral     SpO2 07/15/23 1544 98 %  Weight 07/15/23 1542 175 lb 14.8 oz (79.8 kg)     Height 07/15/23 1542 5\' 9"  (1.753 m)     Head Circumference --      Peak Flow --      Pain Score 07/15/23 1542 8     Pain Loc --      Pain Education --      Exclude from Growth Chart --    No data found.  Updated Vital Signs BP 129/78 (BP Location: Left Arm)   Pulse 96   Temp 99.1 F (37.3 C) (Oral)   Resp 14   Ht 5\' 9"  (1.753 m)   Wt 79.8 kg   LMP 09/02/2014   SpO2 98%   BMI 25.98 kg/m   Visual Acuity Right Eye Distance:   Left Eye Distance:   Bilateral Distance:    Right Eye Near:   Left Eye Near:    Bilateral Near:     Physical Exam GEN:     alert, non-toxic appearing female in no distress    HENT:  mucus membranes moist, oropharyngeal without lesions or  erythema, no tonsillar hypertrophy or exudates, clear nasal discharge EYES:   no scleral injection or discharge NECK:  normal ROM RESP:  no increased work of breathing, clear to auscultation bilaterally CVS:   regular rate and rhythm Skin:   warm and dry, no rash on visible skin    UC Treatments / Results  Labs (all labs ordered are listed, but only abnormal results are displayed) Labs Reviewed  GROUP A STREP BY PCR  SARS CORONAVIRUS 2 BY RT PCR    EKG   Radiology DG Chest 2 View Result Date: 07/15/2023 CLINICAL DATA:  Cough. EXAM: CHEST - 2 VIEW COMPARISON:  None Available. FINDINGS: The heart size and mediastinal contours are within normal limits. Both lungs are clear. The visualized skeletal structures are unremarkable. IMPRESSION: No active cardiopulmonary disease. Electronically Signed   By: Elgie Collard M.D.   On: 07/15/2023 17:30    Procedures Procedures (including critical care time)  Medications Ordered in UC Medications - No data to display  Initial Impression / Assessment and Plan / UC Course  I have reviewed the triage vital signs and the nursing notes.  Pertinent labs & imaging results that were available during my care of the patient were reviewed by me and considered in my medical decision making (see chart for details).       Pt is a 53 y.o. female who presents for 3-4 days of respiratory symptoms. Minta is afebrile here without recent antipyretics. Satting well on room air. Overall pt is non-toxic appearing, well hydrated, without respiratory distress. Pulmonary exam is unremarkable.  Strep pcr is negative. COVID test pending. Pt to quarantine until COVID test results or longer if positive.  I will call patient with test results, if positive.   Chest imaging obtained as pt reports rattling in her chest with an episode of blood tinged sputum.  Chest xray personally reviewed by me without focal pneumonia, pleural effusion, cardiomegaly or pneumothorax.  Radiologist impression reviewed.   History and exam most consistent with viral respiratory illness. Discussed symptomatic treatment.  Explained lack of efficacy of antibiotics in viral disease.  Typical duration of symptoms discussed. Promethazine DM and albuterol inhaler prescribed.   Return and ED precautions given and voiced understanding. Discussed MDM, treatment plan and plan for follow-up with patient who agrees with plan.   COVID test is negative.   Final Clinical Impressions(s) / UC  Diagnoses   Final diagnoses:  Acute cough  Viral URI with cough     Discharge Instructions      Strep test is negative. The lab has to rerun your COVID test. I will call you if this is positive. Stop by the pharmacy to pick up your prescriptions.  Follow up with your primary care provider or return to the urgent care, if not improving.   Your chest xray did not show evidence of pneumonia or bronchitis though the radiologist has not yet read it. If they find something that I didn't, I will call you.    You will see your results in MyChart.        ED Prescriptions     Medication Sig Dispense Auth. Provider   albuterol (VENTOLIN HFA) 108 (90 Base) MCG/ACT inhaler Inhale 2 puffs into the lungs every 4 (four) hours as needed. 6.7 g Kaid Seeberger, DO   promethazine-dextromethorphan (PROMETHAZINE-DM) 6.25-15 MG/5ML syrup Take 5 mLs by mouth 4 (four) times daily as needed. 118 mL Katha Cabal, DO      PDMP not reviewed this encounter.   Katha Cabal, DO 07/15/23 1828

## 2023-11-22 ENCOUNTER — Other Ambulatory Visit: Payer: Self-pay | Admitting: Medical Genetics

## 2023-11-24 ENCOUNTER — Other Ambulatory Visit
Admission: RE | Admit: 2023-11-24 | Discharge: 2023-11-24 | Disposition: A | Discharge status: Home / Self Care | Payer: Self-pay | Source: Ambulatory Visit | Attending: Medical Genetics | Admitting: Medical Genetics

## 2023-12-05 LAB — GENECONNECT MOLECULAR SCREEN: Genetic Analysis Overall Interpretation: NEGATIVE

## 2023-12-29 ENCOUNTER — Ambulatory Visit

## 2024-03-05 ENCOUNTER — Ambulatory Visit: Payer: Managed Care, Other (non HMO) | Admitting: Dermatology

## 2024-03-05 DIAGNOSIS — L578 Other skin changes due to chronic exposure to nonionizing radiation: Secondary | ICD-10-CM | POA: Diagnosis not present

## 2024-03-05 DIAGNOSIS — Z1283 Encounter for screening for malignant neoplasm of skin: Secondary | ICD-10-CM

## 2024-03-05 DIAGNOSIS — D225 Melanocytic nevi of trunk: Secondary | ICD-10-CM

## 2024-03-05 DIAGNOSIS — L821 Other seborrheic keratosis: Secondary | ICD-10-CM

## 2024-03-05 DIAGNOSIS — L814 Other melanin hyperpigmentation: Secondary | ICD-10-CM | POA: Diagnosis not present

## 2024-03-05 DIAGNOSIS — W908XXA Exposure to other nonionizing radiation, initial encounter: Secondary | ICD-10-CM | POA: Diagnosis not present

## 2024-03-05 DIAGNOSIS — Z85828 Personal history of other malignant neoplasm of skin: Secondary | ICD-10-CM

## 2024-03-05 DIAGNOSIS — D1801 Hemangioma of skin and subcutaneous tissue: Secondary | ICD-10-CM

## 2024-03-05 DIAGNOSIS — D229 Melanocytic nevi, unspecified: Secondary | ICD-10-CM

## 2024-03-05 DIAGNOSIS — L649 Androgenic alopecia, unspecified: Secondary | ICD-10-CM

## 2024-03-05 NOTE — Progress Notes (Signed)
 Follow-Up Visit   Subjective  Pamela Colon is a 53 y.o. female who presents for the following: Skin Cancer Screening and Full Body Skin Exam Hx of bcc, Hx of isks, Hx of androgenic alopecia- treats with topical minoxidil  prn   The patient presents for Total-Body Skin Exam (TBSE) for skin cancer screening and mole check. The patient has spots, moles and lesions to be evaluated, some may be new or changing and the patient may have concern these could be cancer.    The following portions of the chart were reviewed this encounter and updated as appropriate: medications, allergies, medical history  Review of Systems:  No other skin or systemic complaints except as noted in HPI or Assessment and Plan.  Objective  Well appearing patient in no apparent distress; mood and affect are within normal limits.  A full examination was performed including scalp, head, eyes, ears, nose, lips, neck, chest, axillae, abdomen, back, buttocks, bilateral upper extremities, bilateral lower extremities, hands, feet, fingers, toes, fingernails, and toenails. All findings within normal limits unless otherwise noted below.   Relevant physical exam findings are noted in the Assessment and Plan.    Assessment & Plan   SKIN CANCER SCREENING PERFORMED TODAY.  ACTINIC DAMAGE - Chronic condition, secondary to cumulative UV/sun exposure - diffuse scaly erythematous macules with underlying dyspigmentation - Recommend daily broad spectrum sunscreen SPF 30+ to sun-exposed areas, reapply every 2 hours as needed.  - Staying in the shade or wearing long sleeves, sun glasses (UVA+UVB protection) and wide brim hats (4-inch brim around the entire circumference of the hat) are also recommended for sun protection.  - Call for new or changing lesions.  LENTIGINES, SEBORRHEIC KERATOSES, HEMANGIOMAS - Benign normal skin lesions - Benign-appearing - Call for any changes  MELANOCYTIC NEVI - R mid back at bra line, 7.0  x 4.0 mm med brown macule  - Tan-brown and/or pink-flesh-colored symmetric macules and papules - Benign appearing on exam today - Observation - Call clinic for new or changing moles - Recommend daily use of broad spectrum spf 30+ sunscreen to sun-exposed areas.   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  Photos compared, slightly improved from baseline, but has worsened since she stopped the minoxidil  2 mons ago.   Chronic and persistent condition with duration or expected duration over one year. Condition is improving with treatment but not currently at goal.      Female Androgenic Alopecia is a chronic condition related to genetics and/or hormonal changes.  In women androgenetic alopecia is commonly associated with menopause but may occur any time after puberty.  It causes hair thinning primarily on the crown with widening of the part and temporal hairline recession.  Can use OTC Rogaine  (minoxidil ) 5% solution/foam as directed.  Oral treatments in female patients who have no contraindication may include : - Low dose oral minoxidil  1.25 - 5mg  daily - Spironolactone  50 - 100mg  bid - Finasteride  2.5 - 5 mg daily Adjunctive therapies include: - Low Level Laser Light Therapy (LLLT) - Platelet-rich plasma injections (PRP) - Hair Transplants or scalp reduction    Treatment Plan: Patient d/c Minoxidil  and oral finasteride  due to side effects . Didn't like the way it made her  feel- heart racing Patient taking Biotin  continue Rogaine  5% topical qhs     HISTORY OF BASAL CELL CARCINOMA OF THE SKIN 10/21/2014 right upper sternum - No evidence of recurrence today - Recommend regular full body skin exams - Recommend daily broad spectrum  sunscreen SPF 30+ to sun-exposed areas, reapply every 2 hours as needed.  - Call if any new or changing lesions are noted between office visits   Return in about 1 year (around 03/05/2025) for TBSE.  I, Eleanor Blush, CMA, am acting as scribe for Rexene Rattler, MD.   Documentation: I have reviewed the above documentation for accuracy and completeness, and I agree with the above.  Rexene Rattler, MD

## 2024-03-05 NOTE — Patient Instructions (Addendum)

## 2025-03-12 ENCOUNTER — Encounter: Admitting: Dermatology
